# Patient Record
Sex: Male | Born: 1985 | Race: Asian | Hispanic: No | Marital: Married | Smoking: Former smoker
Health system: Southern US, Community
[De-identification: ages and names within clinical notes are randomized; demographics above are authoritative.]

## PROBLEM LIST (undated history)

## (undated) DIAGNOSIS — R42 Dizziness and giddiness: Secondary | ICD-10-CM

## (undated) DIAGNOSIS — M545 Low back pain, unspecified: Secondary | ICD-10-CM

## (undated) DIAGNOSIS — E119 Type 2 diabetes mellitus without complications: Secondary | ICD-10-CM

## (undated) DIAGNOSIS — R0781 Pleurodynia: Secondary | ICD-10-CM

## (undated) DIAGNOSIS — R109 Unspecified abdominal pain: Secondary | ICD-10-CM

## (undated) DIAGNOSIS — E538 Deficiency of other specified B group vitamins: Secondary | ICD-10-CM

## (undated) DIAGNOSIS — E785 Hyperlipidemia, unspecified: Secondary | ICD-10-CM

## (undated) DIAGNOSIS — R7989 Other specified abnormal findings of blood chemistry: Secondary | ICD-10-CM

## (undated) DIAGNOSIS — R079 Chest pain, unspecified: Secondary | ICD-10-CM

## (undated) DIAGNOSIS — E559 Vitamin D deficiency, unspecified: Secondary | ICD-10-CM

## (undated) HISTORY — DX: Type 2 diabetes mellitus without complications: E11.9

## (undated) HISTORY — DX: Dizziness and giddiness: R42

## (undated) HISTORY — DX: Hyperlipidemia, unspecified: E78.5

## (undated) HISTORY — DX: Low back pain, unspecified: M54.50

## (undated) HISTORY — DX: Vitamin D deficiency, unspecified: E55.9

## (undated) HISTORY — DX: Deficiency of other specified B group vitamins: E53.8

## (undated) HISTORY — DX: Unspecified abdominal pain: R10.9

## (undated) HISTORY — DX: Chest pain, unspecified: R07.9

## (undated) HISTORY — DX: Other specified abnormal findings of blood chemistry: R79.89

## (undated) HISTORY — DX: Pleurodynia: R07.81

---

## 2020-07-03 ENCOUNTER — Ambulatory Visit (HOSPITAL_COMMUNITY): Payer: Managed Care, Other (non HMO)

## 2020-07-03 ENCOUNTER — Ambulatory Visit (INDEPENDENT_AMBULATORY_CARE_PROVIDER_SITE_OTHER): Payer: Managed Care, Other (non HMO) | Admitting: Physician Assistant

## 2020-07-03 ENCOUNTER — Encounter: Payer: Self-pay | Admitting: Physician Assistant

## 2020-07-03 ENCOUNTER — Other Ambulatory Visit: Payer: Self-pay

## 2020-07-03 VITALS — BP 120/82 | HR 85 | Temp 97.9°F | Ht 71.0 in | Wt 182.5 lb

## 2020-07-03 DIAGNOSIS — Z114 Encounter for screening for human immunodeficiency virus [HIV]: Secondary | ICD-10-CM

## 2020-07-03 DIAGNOSIS — E785 Hyperlipidemia, unspecified: Secondary | ICD-10-CM

## 2020-07-03 DIAGNOSIS — R1031 Right lower quadrant pain: Secondary | ICD-10-CM | POA: Diagnosis not present

## 2020-07-03 DIAGNOSIS — Z1159 Encounter for screening for other viral diseases: Secondary | ICD-10-CM | POA: Diagnosis not present

## 2020-07-03 DIAGNOSIS — E119 Type 2 diabetes mellitus without complications: Secondary | ICD-10-CM | POA: Diagnosis not present

## 2020-07-03 LAB — COMPREHENSIVE METABOLIC PANEL
ALT: 50 U/L (ref 0–53)
AST: 26 U/L (ref 0–37)
Albumin: 4.9 g/dL (ref 3.5–5.2)
Alkaline Phosphatase: 85 U/L (ref 39–117)
BUN: 9 mg/dL (ref 6–23)
CO2: 30 mEq/L (ref 19–32)
Calcium: 10.3 mg/dL (ref 8.4–10.5)
Chloride: 106 mEq/L (ref 96–112)
Creatinine, Ser: 0.81 mg/dL (ref 0.40–1.50)
GFR: 116.23 mL/min (ref >60.00)
Glucose, Bld: 93 mg/dL (ref 70–99)
Potassium: 4.2 mEq/L (ref 3.5–5.1)
Sodium: 142 mEq/L (ref 135–145)
Total Bilirubin: 0.7 mg/dL (ref 0.2–1.2)
Total Protein: 8.2 g/dL (ref 6.0–8.3)

## 2020-07-03 LAB — POC URINALSYSI DIPSTICK (AUTOMATED)
Bilirubin, UA: NEGATIVE
Blood, UA: NEGATIVE
Glucose, UA: NEGATIVE
Ketones, UA: NEGATIVE
Leukocytes, UA: NEGATIVE
Nitrite, UA: NEGATIVE
Protein, UA: NEGATIVE
Spec Grav, UA: 1.015 (ref 1.010–1.025)
Urobilinogen, UA: 1 E.U./dL
pH, UA: 8 (ref 5.0–8.0)

## 2020-07-03 LAB — CBC WITH DIFFERENTIAL/PLATELET
Basophils Absolute: 0.1 10*3/uL (ref 0.0–0.1)
Basophils Relative: 0.7 % (ref 0.0–3.0)
Eosinophils Absolute: 0.1 10*3/uL (ref 0.0–0.7)
Eosinophils Relative: 1.4 % (ref 0.0–5.0)
HCT: 44 % (ref 39.0–52.0)
Hemoglobin: 14.9 g/dL (ref 13.0–17.0)
Lymphocytes Relative: 27 % (ref 12.0–46.0)
Lymphs Abs: 2.1 10*3/uL (ref 0.7–4.0)
MCHC: 33.9 g/dL (ref 30.0–36.0)
MCV: 81.6 fl (ref 78.0–100.0)
Monocytes Absolute: 0.6 10*3/uL (ref 0.1–1.0)
Monocytes Relative: 7.8 % (ref 3.0–12.0)
Neutro Abs: 4.8 10*3/uL (ref 1.4–7.7)
Neutrophils Relative %: 63.1 % (ref 43.0–77.0)
Platelets: 359 10*3/uL (ref 150.0–400.0)
RBC: 5.4 Mil/uL (ref 4.22–5.81)
RDW: 13.4 % (ref 11.5–15.5)
WBC: 7.6 10*3/uL (ref 4.0–10.5)

## 2020-07-03 LAB — LIPASE: Lipase: 43 U/L (ref 11.0–59.0)

## 2020-07-03 NOTE — Progress Notes (Signed)
Daniel Davenport is a 35 y.o. male is here to establish care.  I acted as a Neurosurgeon for [employer], PA-C Corky Mull, LPN   History of Present Illness:   Chief Complaint  Patient presents with  . Establish Care  . Diabetes  . Hyperlipidemia  . Abdominal Pain    HPI   Pt is here to establish care and medication refills.  Diabetes Pt was dx 3 years ago, currently taking Metformin 500 mg BID. Checks blood sugars once a week, fasting averaging 110. Last A1c was 5.14 May 2020. When he was first diagnosed, A1c was 7. Avoids concentrated sweets.    Hyperlipidemia Currently talking Atorvastatin 10 mg daily. Tolerating well. LDL currently 81, as of December 2021.    Abdominal pain Pt c/o RLQ abd pain x 2 weeks, dull pain off and on mainly at night. Has not tried any medications for this. Pain most noticeable at night and in morning. No changes in bowels, constipation, diarrhea, rectal bleeding, nausea, fever, radiation of pain. Denies any lifting or straining. Since he first noticed this, has increased over the past few weeks. Not affected by eating.  Was around 85 kg when he moved to [place] in August 2021. Feels like his weight has decreased due to change in lifestyle.  Wt Readings from Last 3 Encounters:  07/03/20 182 lb 8 oz (82.8 kg)     Health Maintenance Due  Topic Date Due  . Hepatitis C Screening  Never done  . TETANUS/TDAP  Never done  . COVID-19 Vaccine (3 - Booster) 06/14/2020    Past Medical History:  Diagnosis Date  . Diabetes mellitus without complication (HCC)   . Hyperlipidemia      Social History   Tobacco Use  . Smoking status: Former Games developer  . Smokeless tobacco: Never Used  Vaping Use  . Vaping Use: Never used  Substance Use Topics  . Alcohol use: Yes    Comment: socially  . Drug use: Never    History reviewed. No pertinent surgical history.  Family History  Problem Relation Age of Onset  . Diabetes Maternal Grandfather   .  Diabetes Paternal Grandfather   . Heart disease Paternal Grandfather   . Esophageal cancer Maternal Uncle     PMHx, SurgHx, SocialHx, FamHx, Medications, and Allergies were reviewed in the Visit Navigator and updated as appropriate.   There are no problems to display for this patient.   Social History   Tobacco Use  . Smoking status: Former Games developer  . Smokeless tobacco: Never Used  Vaping Use  . Vaping Use: Never used  Substance Use Topics  . Alcohol use: Yes    Comment: socially  . Drug use: Never    Current Medications and Allergies:    Current Outpatient Medications:  .  atorvastatin (LIPITOR) 10 MG tablet, Take 10 mg by mouth daily., Disp: , Rfl:  .  metFORMIN (GLUCOPHAGE) 500 MG tablet, Take 500 mg by mouth 2 (two) times daily., Disp: , Rfl:   No Known Allergies  Review of Systems   ROS Negative unless otherwise specified per HPI.  Vitals:   Vitals:   07/03/20 0937  BP: 120/82  Pulse: 85  Temp: 97.9 F (36.6 C)  TempSrc: Temporal  SpO2: 97%  Weight: 182 lb 8 oz (82.8 kg)  Height: 5\' 11"  (1.803 m)     Body mass index is 25.45 kg/m.   Physical Exam:    Physical Exam Vitals and nursing note reviewed.  Constitutional:  General: He is not in acute distress.    Appearance: He is well-developed. He is not ill-appearing, toxic-appearing or sickly-appearing.  Cardiovascular:     Rate and Rhythm: Normal rate and regular rhythm.     Pulses: Normal pulses.     Heart sounds: Normal heart sounds, S1 normal and S2 normal.     Comments: No LE edema Pulmonary:     Effort: Pulmonary effort is normal.     Breath sounds: Normal breath sounds.  Abdominal:     General: Abdomen is flat. Bowel sounds are normal.     Palpations: Abdomen is soft.     Tenderness: There is abdominal tenderness in the right lower quadrant. There is no right CVA tenderness, left CVA tenderness, guarding or rebound.  Skin:    General: Skin is warm, dry and intact.  Neurological:      Mental Status: He is alert.     GCS: GCS eye subscore is 4. GCS verbal subscore is 5. GCS motor subscore is 6.  Psychiatric:        Mood and Affect: Mood and affect normal.        Speech: Speech normal.        Behavior: Behavior normal. Behavior is cooperative.    Results for orders placed or performed in visit on 07/03/20  POCT Urinalysis Dipstick (Automated)  Result Value Ref Range   Color, UA yellw    Clarity, UA clear    Glucose, UA Negative Negative   Bilirubin, UA negative    Ketones, UA negative    Spec Grav, UA 1.015 1.010 - 1.025   Blood, UA negative    pH, UA 8.0 5.0 - 8.0   Protein, UA Negative Negative   Urobilinogen, UA 1.0 0.2 or 1.0 E.U./dL   Nitrite, UA negative    Leukocytes, UA Negative Negative     Assessment and Plan:    Jamarkis was seen today for establish care, diabetes, hyperlipidemia and abdominal pain.  Diagnoses and all orders for this visit:  Right lower quadrant abdominal pain Pain is not severe and no evidence of acute abdomen at this time however, his pain is gradually worsening with time without explanation. Update blood work and UA. Order CT scan for early next week for further evaluation, with strict precautions to go to urgent/emergent care if symptoms worsen over weekend. -     CBC with Differential/Platelet -     Comprehensive metabolic panel -     POCT Urinalysis Dipstick (Automated) -     Lipase -     CT Abdomen Pelvis W Contrast; Future  Diabetes mellitus without complication (HCC) Stable. Will recheck HgbA1c when due in about 2.5 months. Continue metformin 500 mg BID but consider de-prescribing if numbers are well controlled.  Hyperlipidemia, unspecified hyperlipidemia type Stable per patient. Continue lipitor 10 mg daily.  Encounter for screening for other viral diseases -     Hepatitis C antibody  Screening for HIV (human immunodeficiency virus) -     HIV Antibody (routine testing w rflx)  CMA or LPN served as scribe  during this visit. History, Physical, and Plan performed by medical provider. The above documentation has been reviewed and is accurate and complete.  Jarold Motto, PA-C [place] [place] 07/03/2020  Follow-up: No follow-ups on file.

## 2020-07-03 NOTE — Patient Instructions (Addendum)
It was great to see you!  For your abdominal pain, I'm going to order a urine and blood tests for further evaluation.  Lets plan for a CT scan to further evaluate. You will be contacted on when/where this is.   Abdominal Pain, Adult Many things can cause belly (abdominal) pain. Most times, belly pain is not dangerous. Many cases of belly pain can be watched and treated at home. Sometimes, though, belly pain is serious. Your doctor will try to find the cause of your belly pain. Follow these instructions at home: Medicines  Take over-the-counter and prescription medicines only as told by your doctor.  Do not take medicines that help you poop (laxatives) unless told by your doctor. General instructions  Watch your belly pain for any changes.  Drink enough fluid to keep your pee (urine) pale yellow.  Keep all follow-up visits as told by your doctor. This is important.   Contact a doctor if:  Your belly pain changes or gets worse.  You are not hungry, or you lose weight without trying.  You are having trouble pooping (constipated) or have watery poop (diarrhea) for more than 2-3 days.  You have pain when you pee or poop.  Your belly pain wakes you up at night.  Your pain gets worse with meals, after eating, or with certain foods.  You are vomiting and cannot keep anything down.  You have a fever.  You have blood in your pee. Get help right away if:  Your pain does not go away as soon as your doctor says it should.  You cannot stop vomiting.  Your pain is only in areas of your belly, such as the right side or the left lower part of the belly.  You have bloody or black poop, or poop that looks like tar.  You have very bad pain, cramping, or bloating in your belly.  You have signs of not having enough fluid or water in your body (dehydration), such as: ? Dark pee, very little pee, or no pee. ? Cracked lips. ? Dry mouth. ? Sunken  eyes. ? Sleepiness. ? Weakness.  You have trouble breathing or chest pain. Summary  Many cases of belly pain can be watched and treated at home.  Watch your belly pain for any changes.  Take over-the-counter and prescription medicines only as told by your doctor.  Contact a doctor if your belly pain changes or gets worse.  Get help right away if you have very bad pain, cramping, or bloating in your belly.  Take care,  Jarold Motto PA-C

## 2020-07-06 LAB — HEPATITIS C ANTIBODY
Hepatitis C Ab: NONREACTIVE
SIGNAL TO CUT-OFF: 0.01 (ref <1.00)

## 2020-07-06 LAB — HIV ANTIBODY (ROUTINE TESTING W REFLEX): HIV 1&2 Ab, 4th Generation: NONREACTIVE

## 2020-07-15 ENCOUNTER — Telehealth: Payer: Self-pay

## 2020-07-15 NOTE — Telephone Encounter (Signed)
Pt called about his STAT CT that was ordered on 07/03/2020. Pt was unable to make original appointment scheduled for CT and called office to reschedule. Not sure where there was miscommunication, but the CT never got scheduled. Pt called today asking about when he could get it done. Per Lelon Mast - can we call pt and check to see how his symptoms are? If CT still needs to be stat, a new referral will need to be placed. Please advise.

## 2020-07-16 NOTE — Telephone Encounter (Signed)
Noted.  Since pain has resolved will not proceed with imaging.  Carlena Sax - ok to cancel/close imaging order.

## 2020-07-16 NOTE — Telephone Encounter (Signed)
Daniel Davenport, spoke to pt his pain is gone.

## 2020-07-16 NOTE — Telephone Encounter (Signed)
Spoke to pt told him there was some miscommunication regarding CT scan. Asked pt if he is still having abdominal pain? Pt said no. So no pain right lower abdomen, pain gone? Pt said yes, denies pain. Told pt okay being as symptoms are gone we will hold off on CT scan. Told pt if symptoms returns please let us know. Pt verbalized understanding.

## 2020-07-17 ENCOUNTER — Ambulatory Visit: Payer: Managed Care, Other (non HMO)

## 2020-08-01 ENCOUNTER — Other Ambulatory Visit: Payer: Self-pay | Admitting: Physician Assistant

## 2020-09-09 ENCOUNTER — Ambulatory Visit: Payer: Managed Care, Other (non HMO) | Admitting: Physician Assistant

## 2020-09-09 ENCOUNTER — Ambulatory Visit (INDEPENDENT_AMBULATORY_CARE_PROVIDER_SITE_OTHER): Payer: Managed Care, Other (non HMO) | Admitting: Physician Assistant

## 2020-09-09 ENCOUNTER — Other Ambulatory Visit: Payer: Self-pay

## 2020-09-09 ENCOUNTER — Encounter: Payer: Self-pay | Admitting: Physician Assistant

## 2020-09-09 VITALS — BP 138/88 | HR 83 | Temp 98.6°F | Ht 71.0 in | Wt 188.4 lb

## 2020-09-09 DIAGNOSIS — R29898 Other symptoms and signs involving the musculoskeletal system: Secondary | ICD-10-CM

## 2020-09-09 DIAGNOSIS — E119 Type 2 diabetes mellitus without complications: Secondary | ICD-10-CM

## 2020-09-09 NOTE — Progress Notes (Signed)
Daniel Davenport is a 35 y.o. male here for a new problem.   History of Present Illness:   Chief Complaint  Patient presents with  . Arm Pain    No Hx injury, pain going on for 5 days  Pains worsen after meals     HPI   L arm pain Pain symptoms started about 5 days ago. There was no inciting event. 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 Pain is in his bicep and feels like it radiates down his L arm to his fingers. He is R handed. States that overall, his symptoms are worst after dinner and at night. Pain is getting worse with time. Pain is 7-8/10. Has not tried anything.  Denies: chest pain, SOB, palpitations, jaw pain, neck pain, recent vaxx  He is on 10 mg lipitor daily. Has been on for years.  Diabetes 6 month follow-up. Current DM meds: metformin 500 mg BID . Blood sugars at home are: <150 per patient. Patient is compliant with medications. Denies: hypoglycemic or hyperglycemic episodes or symptoms..  No results found for: HGBA1C    Past Medical History:  Diagnosis Date  . Diabetes mellitus without complication (HCC)   . Hyperlipidemia      Social History   Tobacco Use  . Smoking status: Former Games developer  . Smokeless tobacco: Never Used  Vaping Use  . Vaping Use: Never used  Substance Use Topics  . Alcohol use: Yes    Comment: socially  . Drug use: Never    History reviewed. No pertinent surgical history.  Family History  Problem Relation Age of Onset  . Diabetes Maternal Grandfather   . Diabetes Paternal Grandfather   . Heart disease Paternal Grandfather   . Esophageal cancer Maternal Uncle     No Known Allergies  Current Medications:   Current Outpatient Medications:  .  atorvastatin (LIPITOR) 10 MG tablet, TAKE 1 TABLET BY MOUTH EVERY DAY, Disp: 90 tablet, Rfl: 1 .  metFORMIN (GLUCOPHAGE) 500 MG tablet, TAKE 1 TABLET BY MOUTH TWICE A DAY, Disp: 180 tablet, Rfl: 1   Review of Systems:   ROS  Negative unless otherwise specified  per HPI.   Vitals:   Vitals:   09/09/20 1535  BP: 138/88  Pulse: 83  Temp: 98.6 F (37 C)  TempSrc: Temporal  Weight: 188 lb 6.4 oz (85.5 kg)  Height: 5\' 11"  (1.803 m)     Body mass index is 26.28 kg/m.  Physical Exam:   Physical Exam Vitals and nursing note reviewed.  Constitutional:      General: He is not in acute distress.    Appearance: He is well-developed. He is not ill-appearing or toxic-appearing.  Cardiovascular:     Rate and Rhythm: Normal rate and regular rhythm.     Pulses: Normal pulses.     Heart sounds: Normal heart sounds, S1 normal and S2 normal.     Comments: No LE edema Pulmonary:     Effort: Pulmonary effort is normal.     Breath sounds: Normal breath sounds.  Musculoskeletal:     Comments: L upper arm Normal ROM No reproducible TTP or obvious defomities  Skin:    General: Skin is warm and dry.  Neurological:     Mental Status: He is alert.     GCS: GCS eye subscore is 4. GCS verbal subscore is 5. GCS motor subscore is 6.     Comments: Normal sensation to b/l arm  4/5  strength of L bicep with resisted flexion and extension  Psychiatric:        Speech: Speech normal.        Behavior: Behavior normal. Behavior is cooperative.        Assessment and Plan:   Daniel Davenport was seen today for arm pain.  Diagnoses and all orders for this visit:  Diabetes mellitus without complication (HCC) Update A1c, adjust metformin 500 mg BID as indicated on lab work. -     Hemoglobin A1c  Left arm weakness Unclear etiology. Will check a CK given sx and use of lipitor. Referral to sports medicine for further eval. He declines rx medication; did recommend OTC ibuprofen to see if this  Helps his symptoms. Worsening precautions advised. -     CK (Creatine Kinase) -     Ambulatory referral to Sports Medicine   Daniel Motto, PA-C

## 2020-09-09 NOTE — Patient Instructions (Signed)
It was great to see you!  Update A1c today.  Also will check your muscle enzyme levels to see if this is elevated.  A referral has been placed for you to see Dr. Clementeen Graham with [facility]. Someone from there office will be in touch soon regarding your appointment with him. His location: Psychiatrist Medicine at [facility] [address] on the 1st floor.   Phone number [phone], Fax [phone].  This location is across the street from the entrance to [employer] and in the same complex as the [facility] and [facility]  Consider over the counter ibuprofen for pain  Take care,  Jarold Motto PA-C

## 2020-09-10 LAB — HEMOGLOBIN A1C: Hgb A1c MFr Bld: 5.9 % (ref 4.6–6.5)

## 2020-09-10 LAB — CK: Total CK: 45 U/L (ref 7–232)

## 2020-09-16 ENCOUNTER — Ambulatory Visit: Payer: Managed Care, Other (non HMO) | Admitting: Family Medicine

## 2020-09-16 NOTE — Progress Notes (Deleted)
   Subjective:    I'm seeing this patient as a consultation for:  Rinaldo Cloud, [place]. Note will be routed back to referring provider/PCP.  CC: L arm pain  I, Howell Groesbeck, LAT, ATC, am serving as scribe for Dr. Clementeen Graham.  HPI: Pt is a 35 y/o male presenting w/ L arm pain since 09/04/20 w/ no known MOI.  He locates his pain to .  Radiating pain: yes into his L hand and fingers L UE numbness/tingling: Aggravating factors: worsens after dinner and at night;  Treatments tried:  Past medical history, Surgical history, Family history, Social history, Allergies, and medications have been entered into the medical record, reviewed. ***  Review of Systems: No new headache, visual changes, nausea, vomiting, diarrhea, constipation, dizziness, abdominal pain, skin rash, fevers, chills, night sweats, weight loss, swollen lymph nodes, body aches, joint swelling, muscle aches, chest pain, shortness of breath, mood changes, visual or auditory hallucinations.   Objective:   There were no vitals filed for this visit. General: Well Developed, well nourished, and in no acute distress.  Neuro/Psych: Alert and oriented x3, extra-ocular muscles intact, able to move all 4 extremities, sensation grossly intact. Skin: Warm and dry, no rashes noted.  Respiratory: Not using accessory muscles, speaking in full sentences, trachea midline.  Cardiovascular: Pulses palpable, no extremity edema. Abdomen: Does not appear distended. MSK: ***  Lab and Radiology Results No results found for this or any previous visit (from the past 72 hour(s)). No results found.  Impression and Recommendations:    Assessment and Plan: 35 y.o. male with ***.  PDMP not reviewed this encounter. No orders of the defined types were placed in this encounter.  No orders of the defined types were placed in this encounter.   Discussed warning signs or symptoms. Please see discharge instructions. Patient expresses understanding.   ***

## 2020-09-24 ENCOUNTER — Ambulatory Visit (INDEPENDENT_AMBULATORY_CARE_PROVIDER_SITE_OTHER): Payer: Managed Care, Other (non HMO) | Admitting: Family Medicine

## 2020-09-24 ENCOUNTER — Other Ambulatory Visit: Payer: Self-pay

## 2020-09-24 ENCOUNTER — Encounter: Payer: Self-pay | Admitting: Family Medicine

## 2020-09-24 ENCOUNTER — Ambulatory Visit: Payer: Self-pay

## 2020-09-24 VITALS — BP 128/88 | HR 75 | Ht 71.0 in | Wt 189.8 lb

## 2020-09-24 DIAGNOSIS — R202 Paresthesia of skin: Secondary | ICD-10-CM | POA: Diagnosis not present

## 2020-09-24 DIAGNOSIS — M79602 Pain in left arm: Secondary | ICD-10-CM | POA: Diagnosis not present

## 2020-09-24 MED ORDER — GABAPENTIN 300 MG PO CAPS: 300.0000 mg | ORAL_CAPSULE | Freq: Every evening | ORAL | 3 refills | Status: DC | PRN

## 2020-09-24 NOTE — Patient Instructions (Addendum)
Thank you for coming in today.  Please perform the exercise program that we have prepared for you and gone over in detail on a daily basis.  In addition to the handout you were provided you can access your program through: [email or web address]   Your unique program code is: [email or web address]  Use a carpal tunnel wrist brace.   Try the gabapentin at bedtime as needed.   Recheck in about 1 month.   If not better we can do the nerve test.

## 2020-09-24 NOTE — Progress Notes (Signed)
   I, Christoper Fabian, LAT, ATC, am serving as scribe for Dr. Clementeen Graham.  Subjective:    I'm seeing this patient as a consultation for Jarold Motto, [place]. Note will be routed back to referring provider/PCP.  CC: Left arm pain/weakness  HPI: Pt is a 35 y/o male c/o L arm pain and weakness ongoing since 09/04/20 w/ no MOI. 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 Pt locates pain to L biceps and into his L palmar hand but not so much in the L forearm.    Radiates: yes- into L forearm and fingers Neck pain: No UE Numbness/tingling: yes in the fingers UE Weakness: yes in the bicep/upper arm region Aggravates: holding something for an extended period of time Treatments tried: Nothing  ry, Surgical history, Family history, Social history, Allergies, and medications have been entered into the medical record, reviewed.   Review of Systems: No new headache, visual changes, nausea, vomiting, diarrhea, constipation, dizziness, abdominal pain, skin rash, fevers, chills, night sweats, weight loss, swollen lymph nodes, body aches, joint swelling, muscle aches, chest pain, shortness of breath, mood changes, visual or auditory hallucinations.   Objective:    Vitals:   09/24/20 1553  BP: 128/88  Pulse: 75  SpO2: 98%   General: Well Developed, well nourished, and in no acute distress.  Neuro/Psych: Alert and oriented x3, extra-ocular muscles intact, able to move all 4 extremities, sensation grossly intact. Skin: Warm and dry, no rashes noted.  Respiratory: Not using accessory muscles, speaking in full sentences, trachea midline.  Cardiovascular: Pulses palpable, no extremity edema. Abdomen: Does not appear distended. MSK: Left arm Mature scar overlying proximal volar forearm otherwise normal-appearing Normal elbow motion and strength. Mildly tender palpation overlying distal biceps  Left wrist normal-appearing nontender.  Negative Tinel's and Phalen's test. Intact grip  strength.   C-spine normal.  Normal cervical motion. Upper extremity Strength reflexes and sensation are equal and normal throughout.    Impression and Recommendations:    Assessment and Plan: 35 y.o. male with left hand paresthesia.  This occurs in the setting of left biceps pain.  I am not sure the two are directly related.  I think the most likely explanation for his hand paresthesias are carpal tunnel syndrome.  Median nerve distribution is the likely explanation based on the location of his symptoms.  Carpal tunnel syndrome is the most likely explanation for that.  Plan to treat for carpal tunnel with wrist splint and gabapentin and recheck in a month.  If not improved consider nerve conduction study.  Proximal biceps pain I think it is biceps tendinitis.  Plan to treat with home exercise program as taught in clinic today by ATC.  Again reassess in a month.  Conduction study may be helpful for this problem to in the future as well.  PDMP not reviewed this encounter. No orders of the defined types were placed in this encounter.  Meds ordered this encounter  Medications  . gabapentin (NEURONTIN) 300 MG capsule    Sig: Take 1 capsule (300 mg total) by mouth at bedtime as needed (nerve pain).    Dispense:  90 capsule    Refill:  3    Discussed warning signs or symptoms. Please see discharge instructions. Patient expresses understanding.   The above documentation has been reviewed and is accurate and complete Clementeen Graham, M.D.

## 2020-10-14 ENCOUNTER — Emergency Department (INDEPENDENT_AMBULATORY_CARE_PROVIDER_SITE_OTHER)
Admission: RE | Admit: 2020-10-14 | Discharge: 2020-10-14 | Disposition: A | Discharge status: Home / Self Care | Payer: Managed Care, Other (non HMO) | Source: Ambulatory Visit | Attending: Family Medicine | Admitting: Family Medicine

## 2020-10-14 ENCOUNTER — Other Ambulatory Visit: Payer: Self-pay

## 2020-10-14 VITALS — BP 133/88 | HR 79 | Temp 97.8°F | Resp 18

## 2020-10-14 DIAGNOSIS — B070 Plantar wart: Secondary | ICD-10-CM | POA: Diagnosis not present

## 2020-10-14 NOTE — ED Triage Notes (Signed)
Pt c/o LT foot pain x 3 days. Says there is a spot where the pain is located. Has tried "corn" pads. Pain 6/10 when walking barefoot.

## 2020-10-14 NOTE — ED Provider Notes (Signed)
[facility]    CSN: [id number] Arrival date & time: 10/14/20  1752      History   Chief Complaint Chief Complaint  Patient presents with  . Foot Pain    LT    HPI Daniel Davenport is a 35 y.o. male.   HPI Patient has painful bump on bottom of foot He has been using over-the-counter medicines but is not getting better He is otherwise well Past Medical History:  Diagnosis Date  . Diabetes mellitus without complication (HCC)   . Hyperlipidemia     There are no problems to display for this patient.   History reviewed. No pertinent surgical history.     Home Medications    Prior to Admission medications   Medication Sig Start Date End Date Taking? Authorizing Provider  atorvastatin (LIPITOR) 10 MG tablet TAKE 1 TABLET BY MOUTH EVERY DAY Patient not taking: Reported on 09/24/2020 08/03/20   Jarold Motto, PA  gabapentin (NEURONTIN) 300 MG capsule Take 1 capsule (300 mg total) by mouth at bedtime as needed (nerve pain). 09/24/20   Rodolph Bong, MD  metFORMIN (GLUCOPHAGE) 500 MG tablet TAKE 1 TABLET BY MOUTH TWICE A DAY 08/03/20   Jarold Motto, PA    Family History Family History  Problem Relation Age of Onset  . Diabetes Maternal Grandfather   . Diabetes Paternal Grandfather   . Heart disease Paternal Grandfather   . Esophageal cancer Maternal Uncle     Social History Social History   Tobacco Use  . Smoking status: Former Games developer  . Smokeless tobacco: Never Used  Vaping Use  . Vaping Use: Never used  Substance Use Topics  . Alcohol use: Yes    Comment: socially  . Drug use: Never     Allergies   Patient has no known allergies.   Review of Systems Review of Systems See HPI  Physical Exam Triage Vital Signs ED Triage Vitals  Enc Vitals Group     BP 10/14/20 1806 133/88     Pulse Rate 10/14/20 1806 79     Resp 10/14/20 1806 18     Temp 10/14/20 1806 97.8 F (36.6 C)     Temp Source 10/14/20 1806 Oral     SpO2 10/14/20 1806 99  %     Weight --      Height --      Head Circumference --      Peak Flow --      Pain Score 10/14/20 1808 6     Pain Loc --      Pain Edu? --      Excl. in GC? --    No data found.  Updated Vital Signs BP 133/88 (BP Location: Right Arm)   Pulse 79   Temp 97.8 F (36.6 C) (Oral)   Resp 18   SpO2 99%      Physical Exam Constitutional:      General: He is not in acute distress.    Appearance: Normal appearance. He is well-developed and normal weight.  HENT:     Head: Normocephalic and atraumatic.     Nose:     Comments: Mask is in place Eyes:     Conjunctiva/sclera: Conjunctivae normal.     Pupils: Pupils are equal, round, and reactive to light.  Cardiovascular:     Rate and Rhythm: Normal rate.  Pulmonary:     Effort: Pulmonary effort is normal. No respiratory distress.  Abdominal:     General: There is  no distension.     Palpations: Abdomen is soft.  Musculoskeletal:        General: Normal range of motion.     Cervical back: Normal range of motion.  Skin:    General: Skin is warm and dry.     Comments:  on the bottom of the left foot at the base of the third toe there is a plantar wart that measures 1-1/2 cm across with a punctate center  Neurological:     Mental Status: He is alert.     Gait: Gait normal.  Psychiatric:        Behavior: Behavior normal.      UC Treatments / Results  Labs (all labs ordered are listed, but only abnormal results are displayed) Labs Reviewed - No data to display  EKG   Radiology No results found.  Procedures Procedures (including critical care time)  Medications Ordered in UC Medications - No data to display  Initial Impression / Assessment and Plan / UC Course  I have reviewed the triage vital signs and the nursing notes.  Pertinent labs & imaging results that were available during my care of the patient were reviewed by me and considered in my medical decision making (see chart for details).     Reviewed  variety of treatment options available Final Clinical Impressions(s) / UC Diagnoses   Final diagnoses:  Plantar wart of left foot     Discharge Instructions     You can purchase a wart removal product at the pharmacy   ED Prescriptions    None     PDMP not reviewed this encounter.   Eustace Moore, MD 10/14/20 [phone]

## 2020-10-14 NOTE — Discharge Instructions (Signed)
You can purchase a wart removal product at the pharmacy

## 2020-10-29 ENCOUNTER — Ambulatory Visit: Payer: Managed Care, Other (non HMO) | Admitting: Family Medicine

## 2021-02-01 ENCOUNTER — Other Ambulatory Visit: Payer: Self-pay | Admitting: Physician Assistant

## 2021-03-03 ENCOUNTER — Other Ambulatory Visit: Payer: Self-pay | Admitting: Physician Assistant

## 2021-03-03 NOTE — Telephone Encounter (Signed)
Okay to refill Atorvastatin?

## 2021-03-17 ENCOUNTER — Other Ambulatory Visit: Payer: Self-pay | Admitting: Physician Assistant

## 2021-04-27 NOTE — Progress Notes (Signed)
Subjective:    Daniel Davenport is a 35 y.o. male and is here for a comprehensive physical exam.  HPI  Health Maintenance Due  Topic Date Due   FOOT EXAM  Never done   OPHTHALMOLOGY EXAM  Never done   URINE MICROALBUMIN  Never done   HEMOGLOBIN A1C  03/11/2021   Acute Concerns: Headache [name] reports he has been experiencing a constant headache just above his left eyes, occasionally radiating to the center of his forehead. This has been onset for about two weeks. He is currently drinking and eating regularly and sleeping well at night despite the pain of his headache. Although he is getting an adequate amount of sleep, he still feels tired.  [name] does state he has been dealing with a little bit of stress, but nothing significant. At this time he rates it a 7 out of 10 but has not taken any medication to manage his pain except ASA x 1. He does feel that his headache does worsen during the day. Denies changes in vision, nausea, vomiting, CP, SOB, or concerns for sleep apnea.    During today's visit it was noticed that his BP was elevated. Ranbir was a bit surprised at this since he hasn't had any other sx of HTN despite his constant headache.   BP Readings from Last 3 Encounters:  04/28/21 110/80  10/14/20 133/88  09/24/20 128/88   Chronic Issues: HLD Currently compliant with taking lipitor 10 mg with no adverse effects. He is managing well.   Diabetes Markee is currently compliant with metformin 500 mg  BID with no adverse effects. Blood sugars at home are: 110- 120 firs thing in the morning.  Denies: hypoglycemic or hyperglycemic episodes or symptoms.   Lab Results  Component Value Date   HGBA1C 5.9 09/09/2020    Health Maintenance: Immunizations -- Covid- UTD as of 04/13/21 Tdap- Due at this time Influenza- UTD Colonoscopy -- N/A PSA -- No results found for: PSA1, PSA Diet -- Improving diet by decreasing sugar intake; eats all food groups Sleep habits -- Normal  Schedule Exercise --  30 minute walks about every other day Weight -- Stable; increased 3 lbs since spring visit Weight history Wt Readings from Last 10 Encounters:  04/28/21 192 lb 8 oz (87.3 kg)  09/24/20 189 lb 12.8 oz (86.1 kg)  09/09/20 188 lb 6.4 oz (85.5 kg)  07/03/20 182 lb 8 oz (82.8 kg)   Body mass index is 26.47 kg/m. Mood -- Stable Tobacco use --  Tobacco Use: Medium Risk   Smoking Tobacco Use: Former   Smokeless Tobacco Use: Never   Passive Exposure: Not on file    Alcohol use ---  reports current alcohol use.   Depression screen PHQ 2/9 04/28/2021  Decreased Interest 0  Down, Depressed, Hopeless 0  PHQ - 2 Score 0     Other providers/specialists: Patient Care Team: Jarold Motto, [place] as PCP - General (Physician Assistant)   PMHx, SurgHx, SocialHx, Medications, and Allergies were reviewed in the Visit Navigator and updated as appropriate.   Past Medical History:  Diagnosis Date   Diabetes mellitus without complication (HCC)    Hyperlipidemia     History reviewed. No pertinent surgical history.   Family History  Problem Relation Age of Onset   Diabetes Maternal Grandfather    Diabetes Paternal Grandfather    Heart disease Paternal Grandfather    Esophageal cancer Maternal Uncle    Prostate cancer Neg Hx  Colon cancer Neg Hx     Social History   Tobacco Use   Smoking status: Former   Smokeless tobacco: Never  Building services engineer Use: Never used  Substance Use Topics   Alcohol use: Yes    Comment: socially   Drug use: Never    Review of Systems:   Review of Systems  Constitutional:  Negative for chills, fever, malaise/fatigue and weight loss.  HENT:  Negative for hearing loss, sinus pain and sore throat.   Respiratory:  Negative for cough and hemoptysis.   Cardiovascular:  Negative for chest pain, palpitations, leg swelling and PND.  Gastrointestinal:  Negative for abdominal pain, constipation, diarrhea, heartburn, nausea and  vomiting.  Genitourinary:  Negative for dysuria, frequency and urgency.  Musculoskeletal:  Negative for back pain, myalgias and neck pain.  Skin:  Negative for itching and rash.  Neurological:  Negative for dizziness, tingling, seizures and headaches.  Endo/Heme/Allergies:  Negative for polydipsia.  Psychiatric/Behavioral:  Negative for depression. The patient is not nervous/anxious.    Objective:   Vitals:   04/28/21 0908 04/28/21 0946  BP: (!) 140/100 110/80  Pulse: 74   Temp: 98.2 F (36.8 C)   SpO2: 98%    Body mass index is 26.47 kg/m.  General Appearance:  Alert, cooperative, no distress, appears stated age  Head:  Normocephalic, without obvious abnormality, atraumatic  Eyes:  PERRL, conjunctiva/corneas clear, EOM's intact, fundi benign, both eyes       Ears:  Normal TM's and external ear canals, both ears  Nose: Nares normal, septum midline, mucosa normal, no drainage    or sinus tenderness  Throat: Lips, mucosa, and tongue normal; teeth and gums normal  Neck: Supple, symmetrical, trachea midline, no adenopathy; thyroid:  No enlargement/tenderness/nodules; no carotit bruit or JVD  Back:   Symmetric, no curvature, ROM normal, no CVA tenderness  Lungs:   Clear to auscultation bilaterally, respirations unlabored  Chest wall:  No tenderness or deformity  Heart:  Regular rate and rhythm, S1 and S2 normal, no murmur, rub   or gallop  Abdomen:   Soft, non-tender, bowel sounds active all four quadrants, no masses, no organomegaly  Extremities: Extremities normal, atraumatic, no cyanosis or edema  Prostate: Not done.   Skin: Skin color, texture, turgor normal, no rashes or lesions  Lymph nodes: Cervical, supraclavicular, and axillary nodes normal  Neurologic: CNII-XII grossly intact. Normal strength, sensation and reflexes throughout    Assessment/Plan:   Encounter for general adult medical examination with abnormal findings Today patient counseled on age appropriate routine  health concerns for screening and prevention, each reviewed and up to date or declined. Immunizations reviewed and up to date or declined. Labs ordered and reviewed. Risk factors for depression reviewed and negative. Hearing function and visual acuity are intact. ADLs screened and addressed as needed. Functional ability and level of safety reviewed and appropriate. Education, counseling and referrals performed based on assessed risks today. Patient provided with a copy of personalized plan for preventive services.   Chronic nonintractable headache, unspecified headache type No red flags on discussion Neuro exam is benign Will update blood work to check for possible organic cause of symptoms Trial ibuprofen 400- 600 mg every six to eight hours as needed  BP upon recheck was normal; may consider periodic monitoring Follow up if symptoms worsen or concerns occur, or visit ED immediately    Hyperlipidemia, unspecified hyperlipidemia type Continue lipitor 10 mg daily  Will complete lipid panel and adjust as  indicated by blood results   Diabetes mellitus without complication (HCC) Update A1c today, adjust metformin 500 mg BID as indicated on lab work - Hemoglobin A1c    Patient Counseling: [x]   Nutrition: Stressed importance of moderation in sodium/caffeine intake, saturated fat and cholesterol, caloric balance, sufficient intake of fresh fruits, vegetables, and fiber.  [x]   Stressed the importance of regular exercise.   []   Substance Abuse: Discussed cessation/primary prevention of tobacco, alcohol, or other drug use; driving or other dangerous activities under the influence; availability of treatment for abuse.   [x]   Injury prevention: Discussed safety belts, safety helmets, smoke detector, smoking near bedding or upholstery.   []   Sexuality: Discussed sexually transmitted diseases, partner selection, use of condoms, avoidance of unintended pregnancy  and contraceptive alternatives.   [x]    Dental health: Discussed importance of regular tooth brushing, flossing, and dental visits.  [x]   Health maintenance and immunizations reviewed. Please refer to Health maintenance section.    I,Havlyn C Ratchford,acting as a for , PA.,have documented all relevant documentation on the behalf of , PA,as directed by  , PA while in the presence of , .  I, , Neurosurgeon, have reviewed all documentation for this visit. The documentation on 04/28/21 for the exam, diagnosis, procedures, and orders are all accurate and complete.   Jarold Motto, PA-C [facility]

## 2021-04-28 ENCOUNTER — Other Ambulatory Visit: Payer: Self-pay

## 2021-04-28 ENCOUNTER — Encounter: Payer: Self-pay | Admitting: Physician Assistant

## 2021-04-28 ENCOUNTER — Ambulatory Visit (INDEPENDENT_AMBULATORY_CARE_PROVIDER_SITE_OTHER): Payer: Managed Care, Other (non HMO) | Admitting: Physician Assistant

## 2021-04-28 VITALS — BP 110/80 | HR 74 | Temp 98.2°F | Ht 71.5 in | Wt 192.5 lb

## 2021-04-28 DIAGNOSIS — E785 Hyperlipidemia, unspecified: Secondary | ICD-10-CM

## 2021-04-28 DIAGNOSIS — Z23 Encounter for immunization: Secondary | ICD-10-CM | POA: Diagnosis not present

## 2021-04-28 DIAGNOSIS — E119 Type 2 diabetes mellitus without complications: Secondary | ICD-10-CM | POA: Diagnosis not present

## 2021-04-28 DIAGNOSIS — R519 Headache, unspecified: Secondary | ICD-10-CM

## 2021-04-28 DIAGNOSIS — Z0001 Encounter for general adult medical examination with abnormal findings: Secondary | ICD-10-CM

## 2021-04-28 DIAGNOSIS — G8929 Other chronic pain: Secondary | ICD-10-CM | POA: Diagnosis not present

## 2021-04-28 LAB — CBC WITH DIFFERENTIAL/PLATELET
Basophils Absolute: 0.1 10*3/uL (ref 0.0–0.1)
Basophils Relative: 0.8 % (ref 0.0–3.0)
Eosinophils Absolute: 0.1 10*3/uL (ref 0.0–0.7)
Eosinophils Relative: 1.8 % (ref 0.0–5.0)
HCT: 43.7 % (ref 39.0–52.0)
Hemoglobin: 14.4 g/dL (ref 13.0–17.0)
Lymphocytes Relative: 30.4 % (ref 12.0–46.0)
Lymphs Abs: 2.1 10*3/uL (ref 0.7–4.0)
MCHC: 33 g/dL (ref 30.0–36.0)
MCV: 82 fl (ref 78.0–100.0)
Monocytes Absolute: 0.4 10*3/uL (ref 0.1–1.0)
Monocytes Relative: 5.8 % (ref 3.0–12.0)
Neutro Abs: 4.3 10*3/uL (ref 1.4–7.7)
Neutrophils Relative %: 61.2 % (ref 43.0–77.0)
Platelets: 309 10*3/uL (ref 150.0–400.0)
RBC: 5.33 Mil/uL (ref 4.22–5.81)
RDW: 13.2 % (ref 11.5–15.5)
WBC: 7 10*3/uL (ref 4.0–10.5)

## 2021-04-28 LAB — COMPREHENSIVE METABOLIC PANEL
ALT: 58 U/L — ABNORMAL HIGH (ref 0–53)
AST: 30 U/L (ref 0–37)
Albumin: 4.7 g/dL (ref 3.5–5.2)
Alkaline Phosphatase: 77 U/L (ref 39–117)
BUN: 10 mg/dL (ref 6–23)
CO2: 30 mEq/L (ref 19–32)
Calcium: 9.9 mg/dL (ref 8.4–10.5)
Chloride: 102 mEq/L (ref 96–112)
Creatinine, Ser: 0.8 mg/dL (ref 0.40–1.50)
GFR: 116 mL/min (ref >60.00)
Glucose, Bld: 88 mg/dL (ref 70–99)
Potassium: 4.7 mEq/L (ref 3.5–5.1)
Sodium: 139 mEq/L (ref 135–145)
Total Bilirubin: 0.5 mg/dL (ref 0.2–1.2)
Total Protein: 7.7 g/dL (ref 6.0–8.3)

## 2021-04-28 LAB — LIPID PANEL
Cholesterol: 165 mg/dL (ref 0–200)
HDL: 47.9 mg/dL (ref >39.00)
LDL Cholesterol: 96 mg/dL (ref 0–99)
NonHDL: 116.82
Total CHOL/HDL Ratio: 3
Triglycerides: 105 mg/dL (ref 0.0–149.0)
VLDL: 21 mg/dL (ref 0.0–40.0)

## 2021-04-28 LAB — MICROALBUMIN / CREATININE URINE RATIO
Creatinine,U: 37.6 mg/dL
Microalb Creat Ratio: 1.9 mg/g (ref 0.0–30.0)
Microalb, Ur: <0.7 mg/dL (ref 0.0–1.9)

## 2021-04-28 LAB — TSH: TSH: 1.39 u[IU]/mL (ref 0.35–5.50)

## 2021-04-28 LAB — HEMOGLOBIN A1C: Hgb A1c MFr Bld: 6.1 % (ref 4.6–6.5)

## 2021-04-28 NOTE — Patient Instructions (Addendum)
It was great to see you!  We are going to update blood work today Please schedule an appointment with an eye doctor to have your vision assessed May take oral ibuprofen 400-600 mg every 8 hours as needed  Headache Precautions: Contact a doctor if: Your symptoms are not helped by medicine. You have a headache that feels different than the other headaches. You feel sick to your stomach (nauseous) or you throw up (vomit). You have a fever. Get help right away if: Your headache gets very bad quickly. Your headache gets worse after a lot of physical activity. You keep throwing up. You have a stiff neck. You have trouble seeing. You have trouble speaking. You have pain in the eye or ear. Your muscles are weak or you lose muscle control. You lose your balance or have trouble walking. You feel like you will pass out (faint) or you pass out. You are mixed up (confused). You have a seizure.   Please go to the lab for blood work.   Our office will call you with your results unless you have chosen to receive results via MyChart.  If your blood work is normal we will follow-up each year for physicals and as scheduled for chronic medical problems.  If anything is abnormal we will treat accordingly and get you in for a follow-up.  Take care,  Lelon Mast

## 2021-04-30 LAB — HM DIABETES EYE EXAM

## 2021-05-03 ENCOUNTER — Other Ambulatory Visit: Payer: Self-pay | Admitting: Physician Assistant

## 2021-05-03 DIAGNOSIS — R7989 Other specified abnormal findings of blood chemistry: Secondary | ICD-10-CM

## 2021-05-04 ENCOUNTER — Encounter: Payer: Self-pay | Admitting: Physician Assistant

## 2021-05-24 ENCOUNTER — Other Ambulatory Visit: Payer: Self-pay

## 2021-05-24 ENCOUNTER — Other Ambulatory Visit (INDEPENDENT_AMBULATORY_CARE_PROVIDER_SITE_OTHER): Payer: Managed Care, Other (non HMO)

## 2021-05-24 DIAGNOSIS — R7989 Other specified abnormal findings of blood chemistry: Secondary | ICD-10-CM

## 2021-05-24 LAB — HEPATIC FUNCTION PANEL
ALT: 43 U/L (ref 0–53)
AST: 21 U/L (ref 0–37)
Albumin: 4.5 g/dL (ref 3.5–5.2)
Alkaline Phosphatase: 85 U/L (ref 39–117)
Bilirubin, Direct: 0.1 mg/dL (ref 0.0–0.3)
Total Bilirubin: 0.5 mg/dL (ref 0.2–1.2)
Total Protein: 7.2 g/dL (ref 6.0–8.3)

## 2021-07-25 ENCOUNTER — Other Ambulatory Visit: Payer: Self-pay | Admitting: Physician Assistant

## 2021-09-20 ENCOUNTER — Telehealth: Payer: Managed Care, Other (non HMO) | Admitting: Physician Assistant

## 2021-09-20 DIAGNOSIS — M545 Low back pain, unspecified: Secondary | ICD-10-CM | POA: Diagnosis not present

## 2021-09-20 MED ORDER — NAPROXEN 500 MG PO TABS: 500.0000 mg | ORAL_TABLET | Freq: Two times a day (BID) | ORAL | 0 refills | Status: DC

## 2021-09-20 MED ORDER — CYCLOBENZAPRINE HCL 10 MG PO TABS: 5.0000 mg | ORAL_TABLET | Freq: Three times a day (TID) | ORAL | 0 refills | Status: DC | PRN

## 2021-09-20 NOTE — Progress Notes (Signed)

## 2021-09-27 ENCOUNTER — Other Ambulatory Visit (HOSPITAL_BASED_OUTPATIENT_CLINIC_OR_DEPARTMENT_OTHER): Payer: Self-pay | Admitting: Orthopaedic Surgery

## 2021-09-27 ENCOUNTER — Ambulatory Visit (INDEPENDENT_AMBULATORY_CARE_PROVIDER_SITE_OTHER): Payer: Managed Care, Other (non HMO) | Admitting: Orthopaedic Surgery

## 2021-09-27 ENCOUNTER — Other Ambulatory Visit (HOSPITAL_BASED_OUTPATIENT_CLINIC_OR_DEPARTMENT_OTHER): Payer: Self-pay

## 2021-09-27 ENCOUNTER — Ambulatory Visit (HOSPITAL_BASED_OUTPATIENT_CLINIC_OR_DEPARTMENT_OTHER)
Admission: RE | Admit: 2021-09-27 | Discharge: 2021-09-27 | Disposition: A | Discharge status: Home / Self Care | Payer: Managed Care, Other (non HMO) | Source: Ambulatory Visit | Attending: Orthopaedic Surgery | Admitting: Orthopaedic Surgery

## 2021-09-27 DIAGNOSIS — S39012A Strain of muscle, fascia and tendon of lower back, initial encounter: Secondary | ICD-10-CM

## 2021-09-27 DIAGNOSIS — M545 Low back pain, unspecified: Secondary | ICD-10-CM

## 2021-09-27 MED ORDER — METHOCARBAMOL 500 MG PO TABS
500.0000 mg | ORAL_TABLET | Freq: Four times a day (QID) | ORAL | 3 refills | Status: DC
  Filled 2021-09-27: qty 30, 8d supply, fill #0

## 2021-09-27 NOTE — Progress Notes (Signed)
? ?                            ? ? ?Chief Complaint: Lower back pain ?  ? ? ?History of Present Illness:  ? ? ?Daniel Davenport is a 36 y.o. male presents with 1 week of lower back pain which did not have any specific injury or incident.  He works as a Electronics engineer.  He states that the pain has come on insidiously and is involving both in the flanks of his lower back.  Denies any centrally based pain.  Denies any radiation down his lower extremities.  He did a telehealth visit with [facility] and was placed on Flexeril as well as naproxen.  He states that this is helped somewhat although he has been a little sedated from the Flexeril ? ? ? ?Surgical History:   ?None ? ?PMH/PSH/Family History/Social History/Meds/Allergies:   ? ?Past Medical History:  ?Diagnosis Date  ? Diabetes mellitus without complication (HCC)   ? Hyperlipidemia   ? ?No past surgical history on file. ?Social History  ? ?Socioeconomic History  ? Marital status: Married  ?  Spouse name: Not on file  ? Number of children: Not on file  ? Years of education: Not on file  ? Highest education level: Not on file  ?Occupational History  ? Not on file  ?Tobacco Use  ? Smoking status: Former  ? Smokeless tobacco: Never  ?Vaping Use  ? Vaping Use: Never used  ?Substance and Sexual Activity  ? Alcohol use: Yes  ?  Comment: socially  ? Drug use: Never  ? Sexual activity: Yes  ?Other Topics Concern  ? Not on file  ?Social History Narrative  ? Working for [employer]  ? ?Social Determinants of Health  ? ?Financial Resource Strain: Not on file  ?Food Insecurity: Not on file  ?Transportation Needs: Not on file  ?Physical Activity: Not on file  ?Stress: Not on file  ?Social Connections: Not on file  ? ?Family History  ?Problem Relation Age of Onset  ? Diabetes Maternal Grandfather   ? Diabetes Paternal Grandfather   ? Heart disease Paternal Grandfather   ? Esophageal cancer Maternal Uncle   ? Prostate cancer Neg Hx   ? Colon cancer Neg Hx   ? ?No Known  Allergies ?Current Outpatient Medications  ?Medication Sig Dispense Refill  ? methocarbamol (ROBAXIN) 500 MG tablet Take 1 tablet (500 mg total) by mouth 4 (four) times daily. 30 tablet 3  ? atorvastatin (LIPITOR) 10 MG tablet TAKE 1 TABLET BY MOUTH EVERY DAY 90 tablet 1  ? cyclobenzaprine (FLEXERIL) 10 MG tablet Take 0.5-1 tablets (5-10 mg total) by mouth 3 (three) times daily as needed for muscle spasms. 30 tablet 0  ? gabapentin (NEURONTIN) 300 MG capsule Take 1 capsule (300 mg total) by mouth at bedtime as needed (nerve pain). (Patient not taking: Reported on 04/28/2021) 90 capsule 3  ? metFORMIN (GLUCOPHAGE) 500 MG tablet TAKE 1 TABLET BY MOUTH TWICE A DAY 180 tablet 1  ? naproxen (NAPROSYN) 500 MG tablet Take 1 tablet (500 mg total) by mouth 2 (two) times daily with a meal. 30 tablet 0  ? ?No current facility-administered medications for this visit.  ? ?No results found. ? ?Review of Systems:   ?A ROS was performed including pertinent positives and negatives as documented in the HPI. ? ?Physical Exam :   ?Constitutional: NAD and appears stated  age ?Neurological: Alert and oriented ?Psych: Appropriate affect and cooperative ?There were no vitals taken for this visit.  ? ?Comprehensive Musculoskeletal Exam:   ? ?Tenderness palpation about his obliques particularly worse on the left side.  There is no radiation down the lower extremities full strength and sensation bilateral lower extremities ? ?Imaging:   ?Xray (lumbar spine 4 views): ?Normal ? ?I personally reviewed and interpreted the radiographs. ? ? ?Assessment:   ?36 y.o. male with lower back pain consistent with a lumbar strain.  At this time I recommended nonsedating Robaxin in lieu of Flexeril in order to hopefully allow him to continue to work without issues with sedation.  I have also recommended ergonomic changes such as standing up multiple times daily as he does do a lot of computer work. ? ?Plan :   ? ?-He will return to clinic as needed ? ? ? ? ?I  personally saw and evaluated the patient, and participated in the management and treatment plan. ? ?Huel Cote, MD ?Attending Physician, Orthopedic Surgery ? ?This document was dictated using Conservation officer, historic buildings. A reasonable attempt at proof reading has been made to minimize errors. ?

## 2021-11-16 ENCOUNTER — Other Ambulatory Visit: Payer: Self-pay | Admitting: Physician Assistant

## 2022-02-08 ENCOUNTER — Ambulatory Visit (INDEPENDENT_AMBULATORY_CARE_PROVIDER_SITE_OTHER): Payer: Managed Care, Other (non HMO) | Admitting: Physician Assistant

## 2022-02-08 ENCOUNTER — Encounter: Payer: Self-pay | Admitting: Physician Assistant

## 2022-02-08 ENCOUNTER — Telehealth: Payer: Self-pay | Admitting: Physician Assistant

## 2022-02-08 VITALS — BP 100/70 | HR 86 | Temp 98.8°F | Ht 71.5 in | Wt 190.0 lb

## 2022-02-08 DIAGNOSIS — E119 Type 2 diabetes mellitus without complications: Secondary | ICD-10-CM | POA: Diagnosis not present

## 2022-02-08 DIAGNOSIS — R1012 Left upper quadrant pain: Secondary | ICD-10-CM | POA: Diagnosis not present

## 2022-02-08 DIAGNOSIS — R21 Rash and other nonspecific skin eruption: Secondary | ICD-10-CM

## 2022-02-08 NOTE — Telephone Encounter (Signed)
FYI, see Triage note. Appt today with you.

## 2022-02-08 NOTE — Telephone Encounter (Signed)
Called patient due to him making a virtual video visit for left upper abdominal pain   Patient states: -Pain has been onset for 4-5 days and rated a 6-7/10  - Dull pain   - Pain is focused from belly button to left side of rib cage  - He isn't experiencing any other symptoms  - Pain occurs when walking and after eating  - Tried pepto but this didn't help   Patient has been transferred to triage

## 2022-02-08 NOTE — Patient Instructions (Signed)
It was great to see you!  We are getting blood work today to further evaluate your symptoms  Please start over the counter prilosec to see if this can calm down the acid in your stomach which may be contributing to your symptoms  If your rash gets worse, please let me know -- I will go ahead and place dermatology referral  We may need to get a CT scan or ultrasound of your abdomen if your symptoms do not improve  If worsening, please go to the ER  Take care,  Jarold Motto PA-C

## 2022-02-08 NOTE — Progress Notes (Signed)
Daniel Davenport is a 36 y.o. male here for a new problem.  History of Present Illness:   Chief Complaint  Patient presents with   Abdominal Pain    Pt states abdominal pain has been going on since Friday morning. Pain is on and off, happens after eating, walking and at night.    HPI  Abdominal pain Patient reports that symptoms have been going on since Friday. Intermittent. Happens after eating, walking and at night. Pain is in LUQ and near belly button. After he eats and walks has pain. Tried pepto-bismol without relief. 6-7/10. Dull pain. Poor appetite. No certain foods making it worse.  Does not feel like it is necessarily getting worse with time.  Denies constipation, diarrhea, nausea, vomiting, cough, urinary symptoms.  Did recently travel to [place].  Denies excessive alcohol intake.  Diabetes 10 month follow-up. Current DM meds: Metformin 500 mg twice daily. Blood sugars at home are: Not checked. Patient is compliant with medications. Denies: hypoglycemic or hyperglycemic episodes or symptoms. This patient's diabetes is complicated by hyperlipidemia.  Lab Results  Component Value Date   HGBA1C 6.1 04/28/2021   Skin rash He has noticed red dots on his skin since his abdominal pain has started.  He has not had any fever, chills, recent upper respiratory infection.  Area is not itchy or painful.    Past Medical History:  Diagnosis Date   Diabetes mellitus without complication (HCC)    Hyperlipidemia      Social History   Tobacco Use   Smoking status: Former   Smokeless tobacco: Never  Building services engineer Use: Never used  Substance Use Topics   Alcohol use: Yes    Comment: socially   Drug use: Never    History reviewed. No pertinent surgical history.  Family History  Problem Relation Age of Onset   Diabetes Maternal Grandfather    Diabetes Paternal Grandfather    Heart disease Paternal Grandfather    Esophageal cancer Maternal Uncle    Prostate cancer Neg Hx     Colon cancer Neg Hx     No Known Allergies  Current Medications:   Current Outpatient Medications:    atorvastatin (LIPITOR) 10 MG tablet, TAKE 1 TABLET BY MOUTH EVERY DAY, Disp: 90 tablet, Rfl: 1   metFORMIN (GLUCOPHAGE) 500 MG tablet, TAKE 1 TABLET BY MOUTH TWICE A DAY, Disp: 180 tablet, Rfl: 1   Review of Systems:   ROS Negative unless otherwise specified per HPI.  Vitals:   Vitals:   02/08/22 1512  BP: 100/70  Pulse: 86  Temp: 98.8 F (37.1 C)  SpO2: 98%  Weight: 190 lb (86.2 kg)  Height: 5' 11.5" (1.816 m)     Body mass index is 26.13 kg/m.  Physical Exam:   Physical Exam Vitals and nursing note reviewed.  Constitutional:      General: He is not in acute distress.    Appearance: He is well-developed. He is not ill-appearing or toxic-appearing.  Cardiovascular:     Rate and Rhythm: Normal rate and regular rhythm.     Pulses: Normal pulses.     Heart sounds: Normal heart sounds, S1 normal and S2 normal.  Pulmonary:     Effort: Pulmonary effort is normal.     Breath sounds: Normal breath sounds.  Abdominal:     General: Abdomen is flat. Bowel sounds are increased.     Palpations: Abdomen is soft.     Tenderness: There is no abdominal tenderness. There is  no right CVA tenderness, left CVA tenderness, guarding or rebound.  Skin:    General: Skin is warm and dry.     Comments: Tiny multiple scattered erythematous papules on bilateral arms  Neurological:     Mental Status: He is alert.     GCS: GCS eye subscore is 4. GCS verbal subscore is 5. GCS motor subscore is 6.  Psychiatric:        Speech: Speech normal.        Behavior: Behavior normal. Behavior is cooperative.     Assessment and Plan:   Diabetes mellitus without complication (HCC) We will recheck A1c today and adjust metformin 500 mg twice daily as indicated on results  Left upper quadrant abdominal pain No red flags or evidence of acute abdomen on my exam today We will check blood work to  assess for possible etiology Recommend bland diet, hydration, and trialing over-the-counter Prilosec If lack of improvement will need imaging If worsening needs to go to the ER  Skin rash Unclear etiology Appears to be possible cherry hemangiomas We will go ahead and place referral to dermatology but if worsens in the meantime will need further evaluation in our office prior to dermatology appointment   Jarold Motto, PA-C

## 2022-02-08 NOTE — Telephone Encounter (Signed)
Patient Name: Daniel Davenport Gender: Male DOB: 1985/10/13 Age: 36 Y 7 M 21 D Return Phone Number: [phone] (Primary) Address: City/ State/ Zip: [place]  [address] Client [name] - Administrator, [facility] at [facility] Provider Bufford Buttner, [place]- [place] Contact Type Call Who Is Calling Patient / Member / Family / Caregiver Call Type Triage / Clinical Relationship To Patient Self Return Phone Number [phone] (Primary) Chief Complaint CHEST PAIN - pain, pressure, heaviness or tightness Reason for Call Symptomatic / Request for Health Information Initial Comment Caller states he is having pain in his upper left side radiating into his ribs and the pain is rated at a 6 to 7. Translation No Nurse Assessment Nurse: Nunzio Cory, RN, Sherrie Date/Time (Eastern Time): 02/08/2022 11:20:51 AM Confirm and document reason for call. If symptomatic, describe symptoms. ---Caller states having pain in his upper left side of stomach and radiates into lower rib area. States pain comes and goes, worse when walking or after eating. Pain level 6-7/10. Started Friday morning. No vomiting/diarrhea. No nausea, +eating/drinking. Does the patient have any new or worsening symptoms? ---Yes Will a triage be completed? ---Yes Related visit to physician within the last 2 weeks? ---No Does the PT have any chronic conditions? (i.e. diabetes, asthma, this includes High risk factors for pregnancy, etc.) ---Yes List chronic conditions. ---Type 2 diabetic Is this a behavioral health or substance abuse call? ---No Guidelines Guideline Title Affirmed Question Affirmed Notes Nurse Date/Time (Eastern Time) Abdominal Pain - Male [1] MODERATE pain (e.g., interferes with normal activities) AND [2] pain comes and goes Nunzio Cory, Charity fundraiser, Sherrie 02/08/2022 11:24:09 AM  Guidelines Guideline Title Affirmed Question Affirmed Notes Nurse Date/Time  (Eastern Time) (cramps) AND [3] present > 24 hours (Exception: Pain with Vomiting or Diarrhea - see that Guideline.) Disp. Time Lamount Cohen Time) Disposition Final User 02/08/2022 11:18:40 AM Send to Urgent Lottie Dawson 02/08/2022 11:28:31 AM See PCP within 24 Hours Yes Nunzio Cory, RN, Sherrie Final Disposition 02/08/2022 11:28:31 AM See PCP within 24 Hours Yes Nunzio Cory, RN, Sherrie Caller Disagree/Comply Comply Caller Understands Yes PreDisposition Call Doctor Care Advice Given Per Guideline SEE PCP WITHIN 24 HOURS: * IF OFFICE WILL BE OPEN: You need to be examined within the next 24 hours. Call your doctor (or NP/PA) when the office opens and make an appointment. DIET: * Drink adequate fluids. Eat a bland diet. * Avoid alcohol or caffeinated beverages. * Avoid greasy or fatty foods. CALL BACK IF: * Severe pain lasts over 1 hour * Constant pain lasts over 2 hours * You become worse Referrals REFERRED TO PCP OFFICE

## 2022-02-08 NOTE — Telephone Encounter (Signed)
Patient states: - He was transferred back to office line after speaking with triage nurse - He was informed to be seen by PCP within 24 hours   Patient has an OV with Jarold Motto on 09/05 @ 3:20pm. Awaiting triage note.

## 2022-02-09 LAB — CBC WITH DIFFERENTIAL/PLATELET
Basophils Absolute: 0.1 10*3/uL (ref 0.0–0.1)
Basophils Relative: 1.3 % (ref 0.0–3.0)
Eosinophils Absolute: 0.2 10*3/uL (ref 0.0–0.7)
Eosinophils Relative: 2.7 % (ref 0.0–5.0)
HCT: 40.7 % (ref 39.0–52.0)
Hemoglobin: 13.6 g/dL (ref 13.0–17.0)
Lymphocytes Relative: 37.1 % (ref 12.0–46.0)
Lymphs Abs: 2.9 10*3/uL (ref 0.7–4.0)
MCHC: 33.5 g/dL (ref 30.0–36.0)
MCV: 82.2 fl (ref 78.0–100.0)
Monocytes Absolute: 0.5 10*3/uL (ref 0.1–1.0)
Monocytes Relative: 6.1 % (ref 3.0–12.0)
Neutro Abs: 4.1 10*3/uL (ref 1.4–7.7)
Neutrophils Relative %: 52.8 % (ref 43.0–77.0)
Platelets: 273 10*3/uL (ref 150.0–400.0)
RBC: 4.96 Mil/uL (ref 4.22–5.81)
RDW: 13.2 % (ref 11.5–15.5)
WBC: 7.8 10*3/uL (ref 4.0–10.5)

## 2022-02-09 LAB — URINALYSIS, ROUTINE W REFLEX MICROSCOPIC
Bilirubin Urine: NEGATIVE
Hgb urine dipstick: NEGATIVE
Ketones, ur: NEGATIVE
Leukocytes,Ua: NEGATIVE
Nitrite: NEGATIVE
RBC / HPF: NONE SEEN (ref 0–?)
Specific Gravity, Urine: 1.02 (ref 1.000–1.030)
Total Protein, Urine: NEGATIVE
Urine Glucose: NEGATIVE
Urobilinogen, UA: 0.2 (ref 0.0–1.0)
pH: 8 (ref 5.0–8.0)

## 2022-02-09 LAB — LIPASE: Lipase: 29 U/L (ref 11.0–59.0)

## 2022-02-09 LAB — COMPREHENSIVE METABOLIC PANEL
ALT: 21 U/L (ref 0–53)
AST: 16 U/L (ref 0–37)
Albumin: 4.3 g/dL (ref 3.5–5.2)
Alkaline Phosphatase: 68 U/L (ref 39–117)
BUN: 11 mg/dL (ref 6–23)
CO2: 29 mEq/L (ref 19–32)
Calcium: 9.6 mg/dL (ref 8.4–10.5)
Chloride: 106 mEq/L (ref 96–112)
Creatinine, Ser: 0.75 mg/dL (ref 0.40–1.50)
GFR: 117.63 mL/min (ref >60.00)
Glucose, Bld: 74 mg/dL (ref 70–99)
Potassium: 4.3 mEq/L (ref 3.5–5.1)
Sodium: 141 mEq/L (ref 135–145)
Total Bilirubin: 0.6 mg/dL (ref 0.2–1.2)
Total Protein: 7.2 g/dL (ref 6.0–8.3)

## 2022-02-09 LAB — HEMOGLOBIN A1C: Hgb A1c MFr Bld: 6.3 % (ref 4.6–6.5)

## 2022-02-09 LAB — H. PYLORI ANTIBODY, IGG: H Pylori IgG: NEGATIVE

## 2022-02-12 ENCOUNTER — Encounter: Payer: Self-pay | Admitting: Physician Assistant

## 2022-02-13 ENCOUNTER — Other Ambulatory Visit: Payer: Self-pay | Admitting: Physician Assistant

## 2022-02-14 ENCOUNTER — Other Ambulatory Visit: Payer: Self-pay | Admitting: Physician Assistant

## 2022-02-14 DIAGNOSIS — R1012 Left upper quadrant pain: Secondary | ICD-10-CM

## 2022-02-15 ENCOUNTER — Ambulatory Visit (HOSPITAL_COMMUNITY)
Admission: RE | Admit: 2022-02-15 | Discharge: 2022-02-15 | Disposition: A | Discharge status: Home / Self Care | Payer: Managed Care, Other (non HMO) | Source: Ambulatory Visit | Attending: Physician Assistant | Admitting: Physician Assistant

## 2022-02-15 DIAGNOSIS — R1012 Left upper quadrant pain: Secondary | ICD-10-CM | POA: Diagnosis present

## 2022-02-15 MED ORDER — IOHEXOL 300 MG/ML  SOLN
100.0000 mL | Freq: Once | INTRAMUSCULAR | Status: AC | PRN
Start: 2022-02-15 — End: 2022-02-15
  Administered 2022-02-15: 100 mL via INTRAVENOUS

## 2022-02-15 MED ORDER — IOHEXOL 9 MG/ML PO SOLN
1000.0000 mL | Freq: Once | ORAL | Status: AC
  Administered 2022-02-15: 1000 mL via ORAL

## 2022-05-27 ENCOUNTER — Ambulatory Visit (INDEPENDENT_AMBULATORY_CARE_PROVIDER_SITE_OTHER): Payer: Managed Care, Other (non HMO) | Admitting: Orthopaedic Surgery

## 2022-05-27 ENCOUNTER — Ambulatory Visit (HOSPITAL_BASED_OUTPATIENT_CLINIC_OR_DEPARTMENT_OTHER): Payer: Managed Care, Other (non HMO)

## 2022-05-27 DIAGNOSIS — G8929 Other chronic pain: Secondary | ICD-10-CM | POA: Diagnosis not present

## 2022-05-27 DIAGNOSIS — M222X1 Patellofemoral disorders, right knee: Secondary | ICD-10-CM | POA: Diagnosis not present

## 2022-05-27 DIAGNOSIS — M25561 Pain in right knee: Secondary | ICD-10-CM | POA: Diagnosis not present

## 2022-05-27 NOTE — Progress Notes (Signed)
Chief Complaint: Right knee pain    History of Present Illness:    Daniel Davenport is a 36 y.o. male presents with right knee pain which has been ongoing now over the course of the last several weeks.  States that he is experiencing a pain that is deep and goes to the back of the knee.  Lifting and bending increases knee pain.  He sometimes feels like he needs to bend the knee to improvement.  He did have an MRI in [place].   Surgical History:   None  PMH/PSH/Family History/Social History/Meds/Allergies:    Past Medical History:  Diagnosis Date   Diabetes mellitus without complication (HCC)    Hyperlipidemia    No past surgical history on file. Social History   Socioeconomic History   Marital status: Married    Spouse name: Not on file   Number of children: Not on file   Years of education: Not on file   Highest education level: Not on file  Occupational History   Not on file  Tobacco Use   Smoking status: Former   Smokeless tobacco: Never  Vaping Use   Vaping Use: Never used  Substance and Sexual Activity   Alcohol use: Yes    Comment: socially   Drug use: Never   Sexual activity: Yes  Other Topics Concern   Not on file  Social History Narrative   Working for [employer]   Social Determinants of Health   Financial Resource Strain: Not on file  Food Insecurity: Not on file  Transportation Needs: Not on file  Physical Activity: Not on file  Stress: Not on file  Social Connections: Not on file   Family History  Problem Relation Age of Onset   Diabetes Maternal Grandfather    Diabetes Paternal Grandfather    Heart disease Paternal Grandfather    Esophageal cancer Maternal Uncle    Prostate cancer Neg Hx    Colon cancer Neg Hx    No Known Allergies Current Outpatient Medications  Medication Sig Dispense Refill   atorvastatin (LIPITOR) 10 MG tablet TAKE 1 TABLET BY MOUTH EVERY DAY 90 tablet 1   metFORMIN (GLUCOPHAGE) 500 MG tablet  TAKE 1 TABLET BY MOUTH TWICE A DAY 180 tablet 1   No current facility-administered medications for this visit.   No results found.  Review of Systems:   A ROS was performed including pertinent positives and negatives as documented in the HPI.  Physical Exam :   Constitutional: NAD and appears stated age Neurological: Alert and oriented Psych: Appropriate affect and cooperative There were no vitals taken for this visit.   Comprehensive Musculoskeletal Exam:    Tenderness palpation at the patellofemoral joint.  Range of motion is from 0 to 130 degrees.  Positive patellar grind.  Negative ligamentous exam  Imaging:   Xray (lumbar spine 4 views): Normal  I personally reviewed and interpreted the radiographs.   Assessment:   36 y.o. male with right knee pain consistent with possible patellofemoral pain versus Hoffa's fat pad impingement.  I did discuss that an injection would be the only way to truly differentiate between the 2.  With that being said he would like to begin with patellofemoral exercises which I will provide to him.  He will see me back in 2 weeks as needed should  he want a right knee ultrasound-guided injection  Plan :    -He will return to clinic as needed     I personally saw and evaluated the patient, and participated in the management and treatment plan.  Vanetta Mulders, MD Attending Physician, Orthopedic Surgery  This document was dictated using Dragon voice recognition software. A reasonable attempt at proof reading has been made to minimize errors.

## 2022-06-13 ENCOUNTER — Other Ambulatory Visit: Payer: Self-pay | Admitting: Physician Assistant

## 2022-06-15 ENCOUNTER — Encounter: Payer: Self-pay | Admitting: Physician Assistant

## 2022-06-15 ENCOUNTER — Ambulatory Visit (INDEPENDENT_AMBULATORY_CARE_PROVIDER_SITE_OTHER): Payer: Managed Care, Other (non HMO) | Admitting: Physician Assistant

## 2022-06-15 VITALS — BP 110/80 | HR 93 | Temp 97.5°F | Ht 71.5 in | Wt 195.5 lb

## 2022-06-15 DIAGNOSIS — R21 Rash and other nonspecific skin eruption: Secondary | ICD-10-CM

## 2022-06-15 MED ORDER — TRIAMCINOLONE ACETONIDE 0.025 % EX CREA: TOPICAL_CREAM | CUTANEOUS | 0 refills | Status: DC

## 2022-06-15 NOTE — Progress Notes (Signed)
Daniel Davenport is a 37 y.o. male here for a new problem.  History of Present Illness:   Chief Complaint  Patient presents with   Pruritis    Pt c/o Bilateral lower extremity itching, started 10 days ago. Red bumps on legs.    HPI  BLE itching He reports red bumps diffusely along his bilateral legs below his knee, sparing his feet. These bumps have been severely itching, sometimes keeping him awake at night. This began x1 month ago. He suspected that this may have been due to bed bugs as he was traveling and stayed in an Airbnb prior to onset of his symptoms. However, he did not see any bed bugs. He denies any pain associated with this.  Patient denies any new products or detergents prior to onset of his rash. He denies any new medications or supplements prior to onset. He has been applying a moisturizing Cetaphil cream to the area with some relief. Patient has not tried any medications to relieve the itching.  Denies fevers, chills.  Past Medical History:  Diagnosis Date   Diabetes mellitus without complication ([place])    Hyperlipidemia      Social History   Tobacco Use   Smoking status: Former   Smokeless tobacco: Never  Scientific laboratory technician Use: Never used  Substance Use Topics   Alcohol use: Yes    Comment: socially   Drug use: Never    History reviewed. No pertinent surgical history.  Family History  Problem Relation Age of Onset   Diabetes Maternal Grandfather    Diabetes Paternal Grandfather    Heart disease Paternal Grandfather    Esophageal cancer Maternal Uncle    Prostate cancer Neg Hx    Colon cancer Neg Hx     No Known Allergies  Current Medications:   Current Outpatient Medications:    atorvastatin (LIPITOR) 10 MG tablet, TAKE 1 TABLET BY MOUTH EVERY DAY, Disp: 90 tablet, Rfl: 1   metFORMIN (GLUCOPHAGE) 500 MG tablet, TAKE 1 TABLET BY MOUTH TWICE A DAY, Disp: 180 tablet, Rfl: 1   Review of Systems:   Review of Systems  Constitutional:   Negative for fever.  HENT:  Negative for congestion, sinus pain and sore throat.   Respiratory:  Negative for cough, shortness of breath and wheezing.   Cardiovascular:  Negative for chest pain and palpitations.  Gastrointestinal:  Negative for blood in stool, constipation, diarrhea, nausea and vomiting.  Genitourinary:  Negative for dysuria, frequency and hematuria.  Musculoskeletal:  Negative for joint pain and myalgias.  Skin:  Positive for itching and rash.    Vitals:   Vitals:   06/15/22 0833  BP: 110/80  Pulse: 93  Temp: (!) 97.5 F (36.4 C)  TempSrc: Temporal  SpO2: 98%  Weight: 195 lb 8 oz (88.7 kg)  Height: 5' 11.5" (1.816 m)     Body mass index is 26.89 kg/m.  Physical Exam:   Physical Exam Vitals and nursing note reviewed.  Constitutional:      Appearance: He is well-developed.  HENT:     Head: Normocephalic.  Eyes:     Conjunctiva/sclera: Conjunctivae normal.     Pupils: Pupils are equal, round, and reactive to light.  Pulmonary:     Effort: Pulmonary effort is normal.  Musculoskeletal:        General: Normal range of motion.     Cervical back: Normal range of motion.  Skin:    General: Skin is warm and dry.  Comments: Bilateral erythematous patches without tenderness, induration, fluctuance to lower legs  Neurological:     Mental Status: He is alert and oriented to person, place, and time.  Psychiatric:        Behavior: Behavior normal.        Thought Content: Thought content normal.        Judgment: Judgment normal.     Assessment and Plan:   Rash and nonspecific skin eruption No red flags Suspect possible contact dermatitis Trial topical triamcinolone and daily allergy pill x 2 weeks If new/worsening, close follow-up for possible blood work/bx  I,Alexis Herring,acting as a scribe for [employer], PA.,have documented all relevant documentation on the behalf of Inda Coke, PA,as directed by  Inda Coke, PA while in the  presence of Inda Coke, [place].  I, Inda Coke, [place], have reviewed all documentation for this visit. The documentation on 06/15/22 for the exam, diagnosis, procedures, and orders are all accurate and complete.   Inda Coke, PA-C

## 2022-06-15 NOTE — Patient Instructions (Signed)
It was great to see you!  May use over the counter antihistamines such as Zyrtec (cetirizine), Claritin (loratadine), Allegra (fexofenadine), or Xyzal (levocetirizine) daily as needed. May take twice a day if needed as long as it does not cause drowsiness. Take for 2 weeks  Apply triamcinolone ointment to affected area 1-2 times daily  Take care,  Inda Coke PA-C

## 2022-08-30 NOTE — Progress Notes (Signed)
Daniel Davenport is a 37 y.o. male here for a new problem.  History of Present Illness:   No chief complaint on file.   HPI  Fever and Body Aches Reports experiencing    Past Medical History:  Diagnosis Date   Diabetes mellitus without complication ([place])    Hyperlipidemia      Social History   Tobacco Use   Smoking status: Former   Smokeless tobacco: Never  Scientific laboratory technician Use: Never used  Substance Use Topics   Alcohol use: Yes    Comment: socially   Drug use: Never    No past surgical history on file.  Family History  Problem Relation Age of Onset   Diabetes Maternal Grandfather    Diabetes Paternal Grandfather    Heart disease Paternal Grandfather    Esophageal cancer Maternal Uncle    Prostate cancer Neg Hx    Colon cancer Neg Hx     No Known Allergies  Current Medications:   Current Outpatient Medications:    atorvastatin (LIPITOR) 10 MG tablet, TAKE 1 TABLET BY MOUTH EVERY DAY, Disp: 90 tablet, Rfl: 1   metFORMIN (GLUCOPHAGE) 500 MG tablet, TAKE 1 TABLET BY MOUTH TWICE A DAY, Disp: 180 tablet, Rfl: 1   triamcinolone (KENALOG) 0.025 % cream, Apply to affected area 1-2 times daily, Disp: 454 g, Rfl: 0   Review of Systems:   ROS  Vitals:   There were no vitals filed for this visit.   There is no height or weight on file to calculate BMI.  Physical Exam:   Physical Exam  Assessment and Plan:   ***   I,Alexander Ruley,acting as a scribe for Inda Coke, PA.,have documented all relevant documentation on the behalf of Inda Coke, PA,as directed by  Inda Coke, PA while in the presence of Inda Coke, [place].   ***   Inda Coke, PA-C

## 2022-08-31 ENCOUNTER — Ambulatory Visit (INDEPENDENT_AMBULATORY_CARE_PROVIDER_SITE_OTHER): Payer: Managed Care, Other (non HMO) | Admitting: Physician Assistant

## 2022-08-31 VITALS — BP 138/100 | HR 79 | Temp 97.5°F | Ht 71.5 in | Wt 198.0 lb

## 2022-08-31 DIAGNOSIS — E559 Vitamin D deficiency, unspecified: Secondary | ICD-10-CM

## 2022-08-31 DIAGNOSIS — R52 Pain, unspecified: Secondary | ICD-10-CM

## 2022-08-31 DIAGNOSIS — E119 Type 2 diabetes mellitus without complications: Secondary | ICD-10-CM | POA: Diagnosis not present

## 2022-08-31 DIAGNOSIS — R350 Frequency of micturition: Secondary | ICD-10-CM | POA: Diagnosis not present

## 2022-08-31 DIAGNOSIS — R5383 Other fatigue: Secondary | ICD-10-CM | POA: Diagnosis not present

## 2022-08-31 DIAGNOSIS — E538 Deficiency of other specified B group vitamins: Secondary | ICD-10-CM | POA: Diagnosis not present

## 2022-08-31 LAB — CBC WITH DIFFERENTIAL/PLATELET
Basophils Absolute: 0.1 10*3/uL (ref 0.0–0.1)
Basophils Relative: 1 % (ref 0.0–3.0)
Eosinophils Absolute: 0.1 10*3/uL (ref 0.0–0.7)
Eosinophils Relative: 2.2 % (ref 0.0–5.0)
HCT: 41.1 % (ref 39.0–52.0)
Hemoglobin: 14.2 g/dL (ref 13.0–17.0)
Lymphocytes Relative: 32.5 % (ref 12.0–46.0)
Lymphs Abs: 2.2 10*3/uL (ref 0.7–4.0)
MCHC: 34.7 g/dL (ref 30.0–36.0)
MCV: 79.7 fl (ref 78.0–100.0)
Monocytes Absolute: 0.4 10*3/uL (ref 0.1–1.0)
Monocytes Relative: 6.1 % (ref 3.0–12.0)
Neutro Abs: 3.9 10*3/uL (ref 1.4–7.7)
Neutrophils Relative %: 58.2 % (ref 43.0–77.0)
Platelets: 296 10*3/uL (ref 150.0–400.0)
RBC: 5.16 Mil/uL (ref 4.22–5.81)
RDW: 13.3 % (ref 11.5–15.5)
WBC: 6.6 10*3/uL (ref 4.0–10.5)

## 2022-08-31 LAB — POC URINALSYSI DIPSTICK (AUTOMATED)
Bilirubin, UA: NEGATIVE
Blood, UA: NEGATIVE
Glucose, UA: NEGATIVE
Ketones, UA: NEGATIVE
Leukocytes, UA: NEGATIVE
Nitrite, UA: NEGATIVE
Protein, UA: NEGATIVE
Spec Grav, UA: 1.015 (ref 1.010–1.025)
Urobilinogen, UA: 0.2 E.U./dL
pH, UA: 7 (ref 5.0–8.0)

## 2022-08-31 LAB — COMPREHENSIVE METABOLIC PANEL
ALT: 34 U/L (ref 0–53)
AST: 21 U/L (ref 0–37)
Albumin: 4.7 g/dL (ref 3.5–5.2)
Alkaline Phosphatase: 83 U/L (ref 39–117)
BUN: 9 mg/dL (ref 6–23)
CO2: 26 mEq/L (ref 19–32)
Calcium: 9.7 mg/dL (ref 8.4–10.5)
Chloride: 104 mEq/L (ref 96–112)
Creatinine, Ser: 0.76 mg/dL (ref 0.40–1.50)
GFR: 116.7 mL/min (ref >60.00)
Glucose, Bld: 127 mg/dL — ABNORMAL HIGH (ref 70–99)
Potassium: 4.2 mEq/L (ref 3.5–5.1)
Sodium: 138 mEq/L (ref 135–145)
Total Bilirubin: 0.6 mg/dL (ref 0.2–1.2)
Total Protein: 7.1 g/dL (ref 6.0–8.3)

## 2022-08-31 LAB — POC INFLUENZA A&B (BINAX/QUICKVUE)
Influenza A, POC: NEGATIVE
Influenza B, POC: NEGATIVE

## 2022-08-31 LAB — TSH: TSH: 1.75 u[IU]/mL (ref 0.35–5.50)

## 2022-08-31 LAB — POC COVID19 BINAXNOW: SARS Coronavirus 2 Ag: NEGATIVE

## 2022-08-31 LAB — HEMOGLOBIN A1C: Hgb A1c MFr Bld: 6.6 % — ABNORMAL HIGH (ref 4.6–6.5)

## 2022-08-31 LAB — CK: Total CK: 76 U/L (ref 7–232)

## 2022-08-31 LAB — VITAMIN B12: Vitamin B-12: 286 pg/mL (ref 211–911)

## 2022-08-31 LAB — VITAMIN D 25 HYDROXY (VIT D DEFICIENCY, FRACTURES): VITD: 22.37 ng/mL — ABNORMAL LOW (ref 30.00–100.00)

## 2022-08-31 NOTE — Patient Instructions (Signed)
It was great to see you!  Continue to monitor your symptoms Let me know if anything gets worse or changes  Take care,  Inda Coke PA-C

## 2022-09-01 LAB — URINE CULTURE
MICRO NUMBER:: 14749395
SPECIMEN QUALITY:: ADEQUATE

## 2022-09-06 ENCOUNTER — Encounter: Payer: Self-pay | Admitting: Physician Assistant

## 2022-09-06 MED ORDER — METFORMIN HCL 500 MG PO TABS: ORAL_TABLET | ORAL | 1 refills | Status: DC

## 2022-09-07 ENCOUNTER — Ambulatory Visit (INDEPENDENT_AMBULATORY_CARE_PROVIDER_SITE_OTHER): Payer: Managed Care, Other (non HMO)

## 2022-09-07 DIAGNOSIS — E538 Deficiency of other specified B group vitamins: Secondary | ICD-10-CM | POA: Diagnosis not present

## 2022-09-07 MED ORDER — CYANOCOBALAMIN 1000 MCG/ML IJ SOLN
1000.0000 ug | Freq: Once | INTRAMUSCULAR | Status: AC
  Administered 2022-09-07: 1000 ug via INTRAMUSCULAR

## 2022-09-07 NOTE — Progress Notes (Signed)
Daniel Davenport 37 yr old male presents to office today for 1st of 4 weekly B12 injections per Inda Coke, [place]. Administered CYANOCOBALAMIN 1,000 mcg IM right arm. Patient tolerated well.

## 2022-09-09 ENCOUNTER — Ambulatory Visit: Payer: Managed Care, Other (non HMO) | Admitting: Physician Assistant

## 2022-09-14 ENCOUNTER — Encounter: Payer: Self-pay | Admitting: *Deleted

## 2022-09-14 ENCOUNTER — Ambulatory Visit: Payer: Managed Care, Other (non HMO) | Admitting: *Deleted

## 2022-09-14 DIAGNOSIS — E538 Deficiency of other specified B group vitamins: Secondary | ICD-10-CM | POA: Diagnosis not present

## 2022-09-14 MED ORDER — CYANOCOBALAMIN 1000 MCG/ML IJ SOLN
1000.0000 ug | Freq: Once | INTRAMUSCULAR | Status: AC
  Administered 2022-09-14: 1000 ug via INTRAMUSCULAR

## 2022-09-14 NOTE — Progress Notes (Signed)
Per orders of Samantha Worley, PA, injection of Cyanocobalamin 1000 mcg/ml  given IM by Jeanett Antonopoulos, LPN in left deltoid. Patient tolerated injection well. Patient will make appointment for 1 week.  

## 2022-09-15 ENCOUNTER — Ambulatory Visit: Payer: Managed Care, Other (non HMO) | Admitting: Physician Assistant

## 2022-09-21 ENCOUNTER — Ambulatory Visit (INDEPENDENT_AMBULATORY_CARE_PROVIDER_SITE_OTHER): Payer: Managed Care, Other (non HMO)

## 2022-09-21 DIAGNOSIS — E538 Deficiency of other specified B group vitamins: Secondary | ICD-10-CM

## 2022-09-21 MED ORDER — CYANOCOBALAMIN 1000 MCG/ML IJ SOLN
1000.0000 ug | Freq: Once | INTRAMUSCULAR | Status: AC
  Administered 2022-09-21: 1000 ug via INTRAMUSCULAR

## 2022-09-21 NOTE — Progress Notes (Signed)
Pt here for B12 injection for Samantha Worley, PA. Injection given in RIGHT deltoid. Pt tolerated well.   

## 2022-12-13 ENCOUNTER — Ambulatory Visit: Payer: Managed Care, Other (non HMO) | Admitting: Physician Assistant

## 2022-12-13 ENCOUNTER — Encounter: Payer: Self-pay | Admitting: Physician Assistant

## 2022-12-13 VITALS — BP 120/80 | HR 101 | Temp 98.2°F | Ht 71.5 in | Wt 194.0 lb

## 2022-12-13 DIAGNOSIS — Z7984 Long term (current) use of oral hypoglycemic drugs: Secondary | ICD-10-CM | POA: Diagnosis not present

## 2022-12-13 DIAGNOSIS — R42 Dizziness and giddiness: Secondary | ICD-10-CM

## 2022-12-13 DIAGNOSIS — E559 Vitamin D deficiency, unspecified: Secondary | ICD-10-CM | POA: Diagnosis not present

## 2022-12-13 DIAGNOSIS — E538 Deficiency of other specified B group vitamins: Secondary | ICD-10-CM | POA: Diagnosis not present

## 2022-12-13 DIAGNOSIS — E785 Hyperlipidemia, unspecified: Secondary | ICD-10-CM | POA: Diagnosis not present

## 2022-12-13 DIAGNOSIS — R079 Chest pain, unspecified: Secondary | ICD-10-CM | POA: Diagnosis not present

## 2022-12-13 DIAGNOSIS — R0781 Pleurodynia: Secondary | ICD-10-CM

## 2022-12-13 DIAGNOSIS — E119 Type 2 diabetes mellitus without complications: Secondary | ICD-10-CM

## 2022-12-13 LAB — VITAMIN D 25 HYDROXY (VIT D DEFICIENCY, FRACTURES): VITD: 21.29 ng/mL — ABNORMAL LOW (ref 30.00–100.00)

## 2022-12-13 LAB — LIPID PANEL
Cholesterol: 164 mg/dL (ref 0–200)
HDL: 42.4 mg/dL (ref >39.00)
LDL Cholesterol: 95 mg/dL (ref 0–99)
NonHDL: 121.42
Total CHOL/HDL Ratio: 4
Triglycerides: 134 mg/dL (ref 0.0–149.0)
VLDL: 26.8 mg/dL (ref 0.0–40.0)

## 2022-12-13 LAB — COMPREHENSIVE METABOLIC PANEL
ALT: 48 U/L (ref 0–53)
AST: 25 U/L (ref 0–37)
Albumin: 4.5 g/dL (ref 3.5–5.2)
Alkaline Phosphatase: 74 U/L (ref 39–117)
BUN: 6 mg/dL (ref 6–23)
CO2: 25 mEq/L (ref 19–32)
Calcium: 9.9 mg/dL (ref 8.4–10.5)
Chloride: 103 mEq/L (ref 96–112)
Creatinine, Ser: 0.74 mg/dL (ref 0.40–1.50)
GFR: 117.41 mL/min (ref >60.00)
Glucose, Bld: 149 mg/dL — ABNORMAL HIGH (ref 70–99)
Potassium: 3.8 mEq/L (ref 3.5–5.1)
Sodium: 139 mEq/L (ref 135–145)
Total Bilirubin: 0.4 mg/dL (ref 0.2–1.2)
Total Protein: 6.9 g/dL (ref 6.0–8.3)

## 2022-12-13 LAB — CBC
HCT: 41.5 % (ref 39.0–52.0)
Hemoglobin: 14 g/dL (ref 13.0–17.0)
MCHC: 33.7 g/dL (ref 30.0–36.0)
MCV: 80.4 fl (ref 78.0–100.0)
Platelets: 314 10*3/uL (ref 150.0–400.0)
RBC: 5.16 Mil/uL (ref 4.22–5.81)
RDW: 13.2 % (ref 11.5–15.5)
WBC: 6.8 10*3/uL (ref 4.0–10.5)

## 2022-12-13 LAB — HEMOGLOBIN A1C: Hgb A1c MFr Bld: 6.1 % (ref 4.6–6.5)

## 2022-12-13 LAB — MICROALBUMIN / CREATININE URINE RATIO
Creatinine,U: 91.1 mg/dL
Microalb Creat Ratio: 0.8 mg/g (ref 0.0–30.0)
Microalb, Ur: <0.7 mg/dL (ref 0.0–1.9)

## 2022-12-13 LAB — VITAMIN B12: Vitamin B-12: 322 pg/mL (ref 211–911)

## 2022-12-13 LAB — TSH: TSH: 1.94 u[IU]/mL (ref 0.35–5.50)

## 2022-12-13 NOTE — Progress Notes (Addendum)
Daniel Davenport is a 37 y.o. male here for a new problem.  History of Present Illness:   Chief Complaint  Patient presents with   c/o rib pain    Pt c/o pain left lower side of rib cage x 7-10 days ago. Has not tried any medication.    HPI  Rib pain:  He complains of constant left lower rib pain for the past 10 days.  His pain started after he started doing crunch exercises and stopped them Monday, 12/05/2022.  He is applying topical creams to manage his pain and finds no change.  He denies constipation or bruising around the area.   Chest pain:  He also has occasional left chest pains and left arm pains.  His pain started after he started weight lifting exercises at home.  His blood pressure typically reads 100/80 and his oxygen levels are normal.  He denies recent cough or fever.   Headache:  He also complains of headaches causing him to lose focus a couple days ago  He has light and sound sensitivity and vision changes during headaches.  His headaches cause him to lose focus and causes him difficulty completing work.  He denies nausea, memory issues or difficulty sleeping.  He has taken aspirin once to manage his headache, otherwise he is not taking medication to manage his symptoms.  He drinks a 1-2 cups of coffee daily and 6-7 glasses of water daily.  He maintains consistent and regular diet at home.   HLD: He continues taking 10 mg Lipitor daily PO every night and reports no new issues while taking it.  Lab Results  Component Value Date   CHOL 165 04/28/2021   HDL 47.90 04/28/2021   LDLCALC 96 04/28/2021   TRIG 105.0 04/28/2021   CHOLHDL 3 04/28/2021   DM He continues taking 1000 mg metformin every morning and 500 mg every night and reports no new issues while taking it.  Lab Results  Component Value Date   HGBA1C 6.6 (H) 08/31/2022   Family/Social history: He does not drink alcohol.  His grandfather has a history of heart attacks.    Past Medical History:   Diagnosis Date   Diabetes mellitus without complication (HCC)    Hyperlipidemia      Social History   Tobacco Use   Smoking status: Former   Smokeless tobacco: Never  Building services engineer Use: Never used  Substance Use Topics   Alcohol use: Yes    Comment: socially   Drug use: Never    No past surgical history on file.  Family History  Problem Relation Age of Onset   Diabetes Maternal Grandfather    Diabetes Paternal Grandfather    Heart disease Paternal Grandfather    Esophageal cancer Maternal Uncle    Prostate cancer Neg Hx    Colon cancer Neg Hx     No Known Allergies  Current Medications:   Current Outpatient Medications:    atorvastatin (LIPITOR) 10 MG tablet, TAKE 1 TABLET BY MOUTH EVERY DAY, Disp: 90 tablet, Rfl: 1   metFORMIN (GLUCOPHAGE) 500 MG tablet, TAKE 2 TABLETS (1000 MG) BY MOUTH EVERY MORNING AND 1 TABLET (500 MG) EVERY EVENING., Disp: 270 tablet, Rfl: 1   triamcinolone (KENALOG) 0.025 % cream, Apply to affected area 1-2 times daily, Disp: 454 g, Rfl: 0   Review of Systems:   Review of Systems  Constitutional:  Negative for fever.  HENT:         (+)sound sensitivity  at during headache  Eyes:  Positive for photophobia (during headaches).  Respiratory:  Negative for cough.   Cardiovascular:  Positive for chest pain.  Gastrointestinal:  Negative for constipation and nausea (during headaches).  Musculoskeletal:        (+)lower left rib pain (+)left upper arm man  Neurological:  Positive for headaches (causing him to lose focus).  Endo/Heme/Allergies:        (-)bruising around left ribs  Psychiatric/Behavioral:  Negative for memory loss (during headache).        (+)easily lose focus during headaches    Vitals:   Vitals:   12/13/22 1259  BP: 120/80  Pulse: (!) 101  Temp: 98.2 F (36.8 C)  TempSrc: Temporal  SpO2: 97%  Weight: 194 lb (88 kg)  Height: 5' 11.5" (1.816 m)     Body mass index is 26.68 kg/m.  Orthostatic Vitals for the  past 48 hrs (Last 6 readings):  Patient Position BP Pulse BP Location Cuff Size Patient Position (if appropriate) BP- Standing at 0 minutes Pulse- Standing at 0 minutes BP- Sitting Pulse- Sitting BP- Lying Pulse- Lying  12/13/22 1259 Sitting 120/80 (!) 101 Left Arm Normal -- -- -- -- -- -- --  12/13/22 1335 -- -- -- -- -- Orthostatic Vitals (!) 140/96 98 130/90 98 (!) 142/96 89      Physical Exam:   Physical Exam Vitals and nursing note reviewed.  Constitutional:      General: He is not in acute distress.    Appearance: He is well-developed. He is not ill-appearing or toxic-appearing.  Cardiovascular:     Rate and Rhythm: Normal rate and regular rhythm.     Pulses: Normal pulses.     Heart sounds: Normal heart sounds, S1 normal and S2 normal.  Pulmonary:     Effort: Pulmonary effort is normal.     Breath sounds: Normal breath sounds.  Chest:    Musculoskeletal:     Comments: Tenderness to palpation to anterior lower left rib cage; no obvious deformity  Skin:    General: Skin is warm and dry.  Neurological:     Mental Status: He is alert.     GCS: GCS eye subscore is 4. GCS verbal subscore is 5. GCS motor subscore is 6.  Psychiatric:        Speech: Speech normal.        Behavior: Behavior normal. Behavior is cooperative.     Assessment and Plan:   Chest pain, unspecified type EKG tracing is personally reviewed.  EKG notes NSR.  No acute changes.  Suspect musculoskeletal-related as reproducible with palpation He would like referral to cardiology -- placed today If new/worsening symptom(s), will place  B12 deficiency Update B12 and advise accordingly -- he did well with B12 injections and would like to continue them if needed  Diabetes mellitus without complication (HCC) Update A1c and adjust metformin 1500 mg daily as indicated Follow-up in 3-6 month(s), based on results Update urine screening today as well  Vitamin D deficiency Update Vitamin D and provide  recommendations accordingly  Hyperlipidemia, unspecified hyperlipidemia type Update lipid panel and adjust Lipitor 10 mg daily as indicated  Lightheadedness Orthostatics are negative Update blood work to rule out organic cause Push fluids Referral to cardiology per patient request If new/worsening symptom(s), I asked him to let me know  Rib pain No red flags Recommend cessation of exercise Recommend anti-inflammatories to help reduce pain If no response to anti-inflammatories, or if any worsening symptom(s), will  refer to sports medicine vs obtain xray    [employer] as a scribe for [employer], PA.,have documented all relevant documentation on the behalf of Jarold Motto, PA,as directed by  Jarold Motto, PA while in the presence of Jarold Motto, [place].  I, Jarold Motto, [place], have reviewed all documentation for this visit. The documentation on 12/13/22 for the exam, diagnosis, procedures, and orders are all accurate and complete.  I spent a total of 44 minutes on this visit, today 12/13/22, which obtaining history, ordering tests, discussing plan of care with patient and using shared-decision making on next steps,  and documenting the findings in the note.   Jarold Motto, PA-C

## 2022-12-13 NOTE — Patient Instructions (Signed)
It was great to see you!  We will update blood work and urine studies today  Your EKG was overall normal  Keep me posted on your symptom(s)   Take care,  Jarold Motto PA-C

## 2023-03-17 ENCOUNTER — Ambulatory Visit: Payer: Managed Care, Other (non HMO) | Admitting: Cardiovascular Disease

## 2023-03-23 ENCOUNTER — Other Ambulatory Visit: Payer: Self-pay | Admitting: Physician Assistant

## 2023-05-15 NOTE — Progress Notes (Incomplete)
Daniel Davenport is a 37 y.o. male here for a new problem. History of Present Illness:   Health Maintenance Due  Topic Date Due   INFLUENZA VACCINE  01/05/2023   No chief complaint on file.  HPI Abdominal Pain: {ELStarters:31163}         ***  ***  ***  ***  *** Past Medical History:  Diagnosis Date   Abdominal pain    Chest pain    Diabetes mellitus without complication (HCC)    Elevated LFTs    Hyperlipidemia    Lightheadedness    Low back pain    Rib pain    Vitamin B 12 deficiency    Vitamin D deficiency     Social History   Tobacco Use   Smoking status: Former   Smokeless tobacco: Never  Vaping Use   Vaping status: Never Used  Substance Use Topics   Alcohol use: Yes    Comment: socially   Drug use: Never   No past surgical history on file. Family History  Problem Relation Age of Onset   Diabetes Maternal Grandfather    Diabetes Paternal Grandfather    Heart disease Paternal Grandfather    Esophageal cancer Maternal Uncle    Prostate cancer Neg Hx    Colon cancer Neg Hx    No Known Allergies Current Medications:   Current Outpatient Medications:    atorvastatin (LIPITOR) 10 MG tablet, TAKE 1 TABLET BY MOUTH EVERY DAY, Disp: 90 tablet, Rfl: 1   metFORMIN (GLUCOPHAGE) 500 MG tablet, TAKE 2 TABLETS (1000 MG) BY MOUTH EVERY MORNING AND 1 TABLET (500 MG) EVERY EVENING., Disp: 270 tablet, Rfl: 1   triamcinolone (KENALOG) 0.025 % cream, Apply to affected area 1-2 times daily, Disp: 454 g, Rfl: 0  Review of Systems:   ROS See pertinent positives and negatives as per the HPI.  Vitals:   There were no vitals filed for this visit.   There is no height or weight on file to calculate BMI.  Physical Exam:   Physical Exam  Assessment and Plan:   There are no diagnoses linked to this encounter.            I,Emily Lagle,acting as a Neurosurgeon for [employer], PA.,have documented all relevant documentation on the behalf of Jarold Motto, PA,as directed by  Jarold Motto, PA while in the presence of Jarold Motto, [place].  *** (refresh reminder)  I, Jarold Motto, PA, have reviewed all documentation for this visit. The documentation on 05/15/23 for the exam, diagnosis, procedures, and orders are all accurate and complete.  Jarold Motto, PA-C

## 2023-05-15 NOTE — Progress Notes (Shared)
Daniel Davenport is a 37 y.o. male here for a new problem.  History of Present Illness:   No chief complaint on file.   HPI  Abdominal Pain Reports experiencing RLQ abdominal pain since    Past Medical History:  Diagnosis Date   Abdominal pain    Chest pain    Diabetes mellitus without complication (HCC)    Elevated LFTs    Hyperlipidemia    Lightheadedness    Low back pain    Rib pain    Vitamin B 12 deficiency    Vitamin D deficiency      Social History   Tobacco Use   Smoking status: Former   Smokeless tobacco: Never  Vaping Use   Vaping status: Never Used  Substance Use Topics   Alcohol use: Yes    Comment: socially   Drug use: Never    No past surgical history on file.  Family History  Problem Relation Age of Onset   Diabetes Maternal Grandfather    Diabetes Paternal Grandfather    Heart disease Paternal Grandfather    Esophageal cancer Maternal Uncle    Prostate cancer Neg Hx    Colon cancer Neg Hx     No Known Allergies  Current Medications:   Current Outpatient Medications:    atorvastatin (LIPITOR) 10 MG tablet, TAKE 1 TABLET BY MOUTH EVERY DAY, Disp: 90 tablet, Rfl: 1   metFORMIN (GLUCOPHAGE) 500 MG tablet, TAKE 2 TABLETS (1000 MG) BY MOUTH EVERY MORNING AND 1 TABLET (500 MG) EVERY EVENING., Disp: 270 tablet, Rfl: 1   triamcinolone (KENALOG) 0.025 % cream, Apply to affected area 1-2 times daily, Disp: 454 g, Rfl: 0   Review of Systems:   ROS  Vitals:   There were no vitals filed for this visit.   There is no height or weight on file to calculate BMI.  Physical Exam:   Physical Exam  Assessment and Plan:   ***   I,Alexander Ruley,acting as a scribe for Jarold Motto, PA.,have documented all relevant documentation on the behalf of Jarold Motto, PA,as directed by  Jarold Motto, PA while in the presence of Jarold Motto, [place].   ***  Jarold Motto, PA-C

## 2023-05-16 ENCOUNTER — Ambulatory Visit: Payer: Managed Care, Other (non HMO) | Admitting: Physician Assistant

## 2023-05-16 ENCOUNTER — Ambulatory Visit: Payer: Managed Care, Other (non HMO) | Admitting: Cardiovascular Disease

## 2023-05-16 NOTE — Progress Notes (Signed)
Daniel Davenport is a 37 y.o. male here for a new problem.  History of Present Illness:   No chief complaint on file.   HPI  Abdominal Pain    Past Medical History:  Diagnosis Date   Abdominal pain    Chest pain    Diabetes mellitus without complication (HCC)    Elevated LFTs    Hyperlipidemia    Lightheadedness    Low back pain    Rib pain    Vitamin B 12 deficiency    Vitamin D deficiency      Social History   Tobacco Use   Smoking status: Former   Smokeless tobacco: Never  Vaping Use   Vaping status: Never Used  Substance Use Topics   Alcohol use: Yes    Comment: socially   Drug use: Never    No past surgical history on file.  Family History  Problem Relation Age of Onset   Diabetes Maternal Grandfather    Diabetes Paternal Grandfather    Heart disease Paternal Grandfather    Esophageal cancer Maternal Uncle    Prostate cancer Neg Hx    Colon cancer Neg Hx     No Known Allergies  Current Medications:   Current Outpatient Medications:    atorvastatin (LIPITOR) 10 MG tablet, TAKE 1 TABLET BY MOUTH EVERY DAY, Disp: 90 tablet, Rfl: 1   metFORMIN (GLUCOPHAGE) 500 MG tablet, TAKE 2 TABLETS (1000 MG) BY MOUTH EVERY MORNING AND 1 TABLET (500 MG) EVERY EVENING., Disp: 270 tablet, Rfl: 1   triamcinolone (KENALOG) 0.025 % cream, Apply to affected area 1-2 times daily, Disp: 454 g, Rfl: 0   Review of Systems:   ROS  Vitals:   There were no vitals filed for this visit.   There is no height or weight on file to calculate BMI.  Physical Exam:   Physical Exam  Assessment and Plan:   ***   I,Alexander Ruley,acting as a scribe for Jarold Motto, PA.,have documented all relevant documentation on the behalf of Jarold Motto, PA,as directed by  Jarold Motto, PA while in the presence of Jarold Motto, [place].   ***  Jarold Motto, PA-C

## 2023-05-17 ENCOUNTER — Ambulatory Visit: Payer: Managed Care, Other (non HMO) | Admitting: Physician Assistant

## 2023-05-17 ENCOUNTER — Ambulatory Visit (INDEPENDENT_AMBULATORY_CARE_PROVIDER_SITE_OTHER): Payer: Managed Care, Other (non HMO) | Admitting: Physician Assistant

## 2023-05-17 ENCOUNTER — Encounter: Payer: Self-pay | Admitting: Physician Assistant

## 2023-05-17 VITALS — BP 118/80 | HR 90 | Temp 97.5°F | Ht 71.5 in | Wt 192.5 lb

## 2023-05-17 DIAGNOSIS — Z7984 Long term (current) use of oral hypoglycemic drugs: Secondary | ICD-10-CM | POA: Diagnosis not present

## 2023-05-17 DIAGNOSIS — R1084 Generalized abdominal pain: Secondary | ICD-10-CM | POA: Diagnosis not present

## 2023-05-17 DIAGNOSIS — E119 Type 2 diabetes mellitus without complications: Secondary | ICD-10-CM

## 2023-05-17 NOTE — Patient Instructions (Signed)
It was great to see you!  We will get blood work, urine and stool tests  Lets plan for CT scan tomorrow -- we will call tomorrow to confirm that you want to proceed with this  If any WORSENING symptom(s), go to the ER  Take care,  Jarold Motto PA-C

## 2023-05-18 ENCOUNTER — Telehealth: Payer: Self-pay | Admitting: *Deleted

## 2023-05-18 ENCOUNTER — Ambulatory Visit (HOSPITAL_BASED_OUTPATIENT_CLINIC_OR_DEPARTMENT_OTHER)
Admission: RE | Admit: 2023-05-18 | Discharge: 2023-05-18 | Disposition: A | Discharge status: Home / Self Care | Payer: Managed Care, Other (non HMO) | Source: Ambulatory Visit | Attending: Physician Assistant | Admitting: Physician Assistant

## 2023-05-18 DIAGNOSIS — R1084 Generalized abdominal pain: Secondary | ICD-10-CM | POA: Diagnosis present

## 2023-05-18 LAB — COMPREHENSIVE METABOLIC PANEL
ALT: 75 U/L — ABNORMAL HIGH (ref 0–53)
AST: 32 U/L (ref 0–37)
Albumin: 4.3 g/dL (ref 3.5–5.2)
Alkaline Phosphatase: 69 U/L (ref 39–117)
BUN: 9 mg/dL (ref 6–23)
CO2: 24 meq/L (ref 19–32)
Calcium: 9.5 mg/dL (ref 8.4–10.5)
Chloride: 105 meq/L (ref 96–112)
Creatinine, Ser: 0.72 mg/dL (ref 0.40–1.50)
GFR: 118.04 mL/min (ref >60.00)
Glucose, Bld: 161 mg/dL — ABNORMAL HIGH (ref 70–99)
Potassium: 4 meq/L (ref 3.5–5.1)
Sodium: 139 meq/L (ref 135–145)
Total Bilirubin: 0.6 mg/dL (ref 0.2–1.2)
Total Protein: 7 g/dL (ref 6.0–8.3)

## 2023-05-18 LAB — CBC WITH DIFFERENTIAL/PLATELET
Basophils Absolute: 0.1 10*3/uL (ref 0.0–0.1)
Basophils Relative: 1.1 % (ref 0.0–3.0)
Eosinophils Absolute: 0.1 10*3/uL (ref 0.0–0.7)
Eosinophils Relative: 0.7 % (ref 0.0–5.0)
HCT: 40.2 % (ref 39.0–52.0)
Hemoglobin: 13.9 g/dL (ref 13.0–17.0)
Lymphocytes Relative: 22.1 % (ref 12.0–46.0)
Lymphs Abs: 1.9 10*3/uL (ref 0.7–4.0)
MCHC: 34.6 g/dL (ref 30.0–36.0)
MCV: 80.9 fL (ref 78.0–100.0)
Monocytes Absolute: 0.4 10*3/uL (ref 0.1–1.0)
Monocytes Relative: 5 % (ref 3.0–12.0)
Neutro Abs: 6 10*3/uL (ref 1.4–7.7)
Neutrophils Relative %: 71.1 % (ref 43.0–77.0)
Platelets: 352 10*3/uL (ref 150.0–400.0)
RBC: 4.97 Mil/uL (ref 4.22–5.81)
RDW: 13.5 % (ref 11.5–15.5)
WBC: 8.4 10*3/uL (ref 4.0–10.5)

## 2023-05-18 LAB — URINALYSIS, ROUTINE W REFLEX MICROSCOPIC
Bilirubin Urine: NEGATIVE
Hgb urine dipstick: NEGATIVE
Ketones, ur: NEGATIVE
Leukocytes,Ua: NEGATIVE
Nitrite: NEGATIVE
RBC / HPF: NONE SEEN (ref 0–?)
Specific Gravity, Urine: 1.01 (ref 1.000–1.030)
Total Protein, Urine: NEGATIVE
Urine Glucose: NEGATIVE
Urobilinogen, UA: 0.2 (ref 0.0–1.0)
WBC, UA: NONE SEEN (ref 0–?)
pH: 7 (ref 5.0–8.0)

## 2023-05-18 LAB — HEMOGLOBIN A1C: Hgb A1c MFr Bld: 6.4 % (ref 4.6–6.5)

## 2023-05-18 LAB — LIPASE: Lipase: 53 U/L (ref 11.0–59.0)

## 2023-05-18 MED ORDER — IOHEXOL 300 MG/ML  SOLN
100.0000 mL | Freq: Once | INTRAMUSCULAR | Status: AC | PRN
  Administered 2023-05-18: 85 mL via INTRAVENOUS

## 2023-05-18 NOTE — Telephone Encounter (Signed)
Pt called back told her CT scan was approved do you want to move forward and have done? Pt said yes. Told him okay someone will contact to schedule CT scan. Pt verbalized understanding.

## 2023-05-18 NOTE — Telephone Encounter (Signed)
Left message on voicemail to call office.  

## 2023-05-19 ENCOUNTER — Encounter: Payer: Self-pay | Admitting: Physician Assistant

## 2023-05-19 ENCOUNTER — Other Ambulatory Visit: Payer: Self-pay | Admitting: Physician Assistant

## 2023-05-19 DIAGNOSIS — R7989 Other specified abnormal findings of blood chemistry: Secondary | ICD-10-CM

## 2023-05-19 NOTE — Telephone Encounter (Signed)
Please see pt response.

## 2023-05-21 LAB — GI PROFILE, STOOL, PCR
Adenovirus F 40/41: NOT DETECTED
Astrovirus: NOT DETECTED
C difficile toxin A/B: NOT DETECTED
Campylobacter: NOT DETECTED
Cryptosporidium: NOT DETECTED
Cyclospora cayetanensis: NOT DETECTED
E coli O157: NOT DETECTED
Entamoeba histolytica: NOT DETECTED
Enteroaggregative E coli: DETECTED — AB
Enterotoxigenic E coli: NOT DETECTED
Giardia lamblia: DETECTED — AB
Norovirus GI/GII: NOT DETECTED
Plesiomonas shigelloides: NOT DETECTED
Rotavirus A: NOT DETECTED
Salmonella: NOT DETECTED
Sapovirus: NOT DETECTED
Shiga-toxin-producing E coli: DETECTED — AB
Shigella/Enteroinvasive E coli: NOT DETECTED
Vibrio cholerae: NOT DETECTED
Vibrio: NOT DETECTED
Yersinia enterocolitica: NOT DETECTED

## 2023-05-22 ENCOUNTER — Other Ambulatory Visit: Payer: Self-pay | Admitting: Physician Assistant

## 2023-05-22 MED ORDER — TINIDAZOLE 500 MG PO TABS: 2.0000 g | ORAL_TABLET | Freq: Once | ORAL | 0 refills | Status: AC

## 2023-05-24 LAB — URINE CULTURE

## 2023-05-24 LAB — H. PYLORI BREATH TEST: H. pylori Breath Test: NOT DETECTED

## 2023-06-02 ENCOUNTER — Emergency Department (HOSPITAL_BASED_OUTPATIENT_CLINIC_OR_DEPARTMENT_OTHER): Payer: Managed Care, Other (non HMO)

## 2023-06-02 ENCOUNTER — Other Ambulatory Visit: Payer: Self-pay

## 2023-06-02 ENCOUNTER — Emergency Department (HOSPITAL_BASED_OUTPATIENT_CLINIC_OR_DEPARTMENT_OTHER)
Admission: EM | Admit: 2023-06-02 | Discharge: 2023-06-02 | Disposition: A | Discharge status: Home / Self Care | Payer: Managed Care, Other (non HMO) | Attending: Emergency Medicine | Admitting: Emergency Medicine

## 2023-06-02 DIAGNOSIS — R1011 Right upper quadrant pain: Secondary | ICD-10-CM | POA: Insufficient documentation

## 2023-06-02 DIAGNOSIS — R1031 Right lower quadrant pain: Secondary | ICD-10-CM | POA: Insufficient documentation

## 2023-06-02 DIAGNOSIS — R109 Unspecified abdominal pain: Secondary | ICD-10-CM

## 2023-06-02 LAB — COMPREHENSIVE METABOLIC PANEL
ALT: 77 U/L — ABNORMAL HIGH (ref 0–44)
AST: 36 U/L (ref 15–41)
Albumin: 4.3 g/dL (ref 3.5–5.0)
Alkaline Phosphatase: 83 U/L (ref 38–126)
Anion gap: 7 (ref 5–15)
BUN: 10 mg/dL (ref 6–20)
CO2: 29 mmol/L (ref 22–32)
Calcium: 9.8 mg/dL (ref 8.9–10.3)
Chloride: 103 mmol/L (ref 98–111)
Creatinine, Ser: 0.71 mg/dL (ref 0.61–1.24)
GFR, Estimated: >60 mL/min (ref >60)
Glucose, Bld: 160 mg/dL — ABNORMAL HIGH (ref 70–99)
Potassium: 4.1 mmol/L (ref 3.5–5.1)
Sodium: 139 mmol/L (ref 135–145)
Total Bilirubin: 0.6 mg/dL (ref <1.2)
Total Protein: 7.5 g/dL (ref 6.5–8.1)

## 2023-06-02 LAB — URINALYSIS, ROUTINE W REFLEX MICROSCOPIC
Bilirubin Urine: NEGATIVE
Glucose, UA: NEGATIVE mg/dL
Hgb urine dipstick: NEGATIVE
Ketones, ur: NEGATIVE mg/dL
Leukocytes,Ua: NEGATIVE
Nitrite: NEGATIVE
Protein, ur: NEGATIVE mg/dL
Specific Gravity, Urine: 1.021 (ref 1.005–1.030)
pH: 7.5 (ref 5.0–8.0)

## 2023-06-02 LAB — CBC
HCT: 41.2 % (ref 39.0–52.0)
Hemoglobin: 14.1 g/dL (ref 13.0–17.0)
MCH: 27.7 pg (ref 26.0–34.0)
MCHC: 34.2 g/dL (ref 30.0–36.0)
MCV: 80.9 fL (ref 80.0–100.0)
Platelets: 293 10*3/uL (ref 150–400)
RBC: 5.09 MIL/uL (ref 4.22–5.81)
RDW: 12.8 % (ref 11.5–15.5)
WBC: 6.3 10*3/uL (ref 4.0–10.5)
nRBC: 0 % (ref 0.0–0.2)

## 2023-06-02 LAB — C DIFFICILE QUICK SCREEN W PCR REFLEX
C Diff antigen: NEGATIVE
C Diff interpretation: NOT DETECTED
C Diff toxin: NEGATIVE

## 2023-06-02 LAB — LIPASE, BLOOD: Lipase: 31 U/L (ref 11–51)

## 2023-06-02 MED ORDER — IOHEXOL 300 MG/ML  SOLN
100.0000 mL | Freq: Once | INTRAMUSCULAR | Status: AC | PRN
  Administered 2023-06-02: 100 mL via INTRAVENOUS

## 2023-06-02 NOTE — ED Notes (Signed)
Pt aware of need for stool sample, pt unable to provide at this time.

## 2023-06-02 NOTE — Discharge Instructions (Signed)
I will call you first thing tomorrow morning when your PCR results are back for your stool sample provided today.  If needed, I will order prescriptions over the phone to your pharmacy for you.  Otherwise please follow with your primary care physician.

## 2023-06-02 NOTE — ED Provider Notes (Signed)
[place] EMERGENCY DEPARTMENT AT [facility] Provider Note   CSN: [id number] Arrival date & time: 06/02/23  1400     History {Add pertinent medical, surgical, social history, OB history to HPI:1} Chief Complaint  Patient presents with   Abdominal Pain    Daniel Davenport is a 37 y.o. male.  Patient is a 37 yo male presenting for rlq abdominal pain. Pt had hx of rlq abdominal pain and diarrhea earlier this month and had positive stool pcr on 05/18/23 for giardia, shiga-toxin e.coli, and enteroaggregative E coli. He was treated with Tinidazole 500 mg (4 tablets) x 1 dose 10 days ago and has improvement of diarrhea but worsening of rlq abdominal pain that now radiates to the ruq as well. Denies fevers, chills, nausea, or vomiting. Denies black or bloody stools.   The history is provided by the patient. No language interpreter was used.  Abdominal Pain Associated symptoms: no chest pain, no chills, no cough, no diarrhea, no dysuria, no fever, no hematuria, no nausea, no shortness of breath, no sore throat and no vomiting        Home Medications Prior to Admission medications   Medication Sig Start Date End Date Taking? Authorizing Provider  atorvastatin (LIPITOR) 10 MG tablet TAKE 1 TABLET BY MOUTH EVERY DAY 03/23/23   Jarold Motto, PA  metFORMIN (GLUCOPHAGE) 500 MG tablet TAKE 2 TABLETS (1000 MG) BY MOUTH EVERY MORNING AND 1 TABLET (500 MG) EVERY EVENING. 03/23/23   Jarold Motto, PA      Allergies    Patient has no known allergies.    Review of Systems   Review of Systems  Constitutional:  Negative for chills and fever.  HENT:  Negative for ear pain and sore throat.   Eyes:  Negative for pain and visual disturbance.  Respiratory:  Negative for cough and shortness of breath.   Cardiovascular:  Negative for chest pain and palpitations.  Gastrointestinal:  Positive for abdominal pain. Negative for blood in stool, diarrhea, nausea and vomiting.  Genitourinary:   Negative for dysuria and hematuria.  Musculoskeletal:  Negative for arthralgias and back pain.  Skin:  Negative for color change and rash.  Neurological:  Negative for seizures and syncope.  All other systems reviewed and are negative.   Physical Exam Updated Vital Signs BP 138/86   Pulse 97   Temp 98.2 F (36.8 C)   Resp 16   Ht 5' 11.5" (1.816 m)   Wt 86.2 kg   SpO2 99%   BMI 26.13 kg/m  Physical Exam Vitals and nursing note reviewed.  Constitutional:      General: He is not in acute distress.    Appearance: He is well-developed.  HENT:     Head: Normocephalic and atraumatic.  Eyes:     Conjunctiva/sclera: Conjunctivae normal.  Cardiovascular:     Rate and Rhythm: Normal rate and regular rhythm.     Heart sounds: No murmur heard. Pulmonary:     Effort: Pulmonary effort is normal. No respiratory distress.     Breath sounds: Normal breath sounds.  Abdominal:     Palpations: Abdomen is soft.     Tenderness: There is abdominal tenderness in the right upper quadrant and right lower quadrant. There is no guarding or rebound. Negative signs include Murphy's sign and Rovsing's sign.  Musculoskeletal:        General: No swelling.     Cervical back: Neck supple.  Skin:    General: Skin is warm and dry.  Capillary Refill: Capillary refill takes less than 2 seconds.  Neurological:     Mental Status: He is alert.  Psychiatric:        Mood and Affect: Mood normal.     ED Results / Procedures / Treatments   Labs (all labs ordered are listed, but only abnormal results are displayed) Labs Reviewed  COMPREHENSIVE METABOLIC PANEL - Abnormal; Notable for the following components:      Result Value   Glucose, Bld 160 (*)    ALT 77 (*)    All other components within normal limits  C DIFFICILE QUICK SCREEN W PCR REFLEX    LIPASE, BLOOD  CBC  URINALYSIS, ROUTINE W REFLEX MICROSCOPIC    EKG None  Radiology No results found.  Procedures Procedures  {Document  cardiac monitor, telemetry assessment procedure when appropriate:1}  Medications Ordered in ED Medications - No data to display  ED Course/ Medical Decision Making/ A&P   {   Click here for ABCD2, HEART and other calculatorsREFRESH Note before signing :1}                              Medical Decision Making Amount and/or Complexity of Data Reviewed Labs: ordered. Radiology: ordered.    37 yo male presenting for rlq abdominal pain. Pt had hx of rlq abdominal pain and diarrhea earlier this month and had positive stool pcr on 05/18/23 for giardia, shiga-toxin e.coli, and enteroaggregative E coli. He was treated with Tinidazole 500 mg (4 tablets) x 1 dose 10 days ago and has improvement of diarrhea but worsening of rlq abdominal pain that now radiates to the ruq as well. Denies fevers, chills, nausea, or vomiting. Denies black or bloody stools.   {Document critical care time when appropriate:1} {Document review of labs and clinical decision tools ie heart score, Chads2Vasc2 etc:1}  {Document your independent review of radiology images, and any outside records:1} {Document your discussion with family members, caretakers, and with consultants:1} {Document social determinants of health affecting pt's care:1} {Document your decision making why or why not admission, treatments were needed:1} Final Clinical Impression(s) / ED Diagnoses Final diagnoses:  None    Rx / DC Orders ED Discharge Orders     None

## 2023-06-02 NOTE — ED Triage Notes (Addendum)
Seen by PCP several weeks ago for same complaints. Was given antibiotics for findings in stool sample.  Comes in today for right sided abd pain radiating at times into right flank. Worse with sitting. Last BM this AM-no changes to stool. No urinary changes noticed. No HX abd surgeries. Denies N/V/D,  CP/ SOB.

## 2023-06-04 ENCOUNTER — Telehealth (HOSPITAL_BASED_OUTPATIENT_CLINIC_OR_DEPARTMENT_OTHER): Payer: Self-pay | Admitting: Emergency Medicine

## 2023-06-04 MED ORDER — CIPROFLOXACIN HCL 500 MG PO TABS: 500.0000 mg | ORAL_TABLET | Freq: Two times a day (BID) | ORAL | 0 refills | Status: DC

## 2023-06-04 MED ORDER — METRONIDAZOLE 500 MG PO TABS: 500.0000 mg | ORAL_TABLET | Freq: Two times a day (BID) | ORAL | 0 refills | Status: DC

## 2023-06-04 MED ORDER — HYDROCODONE-ACETAMINOPHEN 5-325 MG PO TABS: 1.0000 | ORAL_TABLET | Freq: Four times a day (QID) | ORAL | 0 refills | Status: DC | PRN

## 2023-06-04 NOTE — Telephone Encounter (Signed)
Nursing received a call that the patient had tested positive for Giardia and was still having bad diarrhea and abdominal pain.  He also had a GI pathogen panel that was positive for enteroaggregate of E. coli.  He reports ongoing abdominal pain.  Will treat with a course of pain medication, denies any active nausea.  Confirmed the patient's pharmacy and sent in Cipro/Flagyl and opiate pain medication for short course.

## 2023-06-05 LAB — GASTROINTESTINAL PANEL BY PCR, STOOL (REPLACES STOOL CULTURE)
Adenovirus F40/41: NOT DETECTED
Astrovirus: NOT DETECTED
Campylobacter species: NOT DETECTED
Cryptosporidium: NOT DETECTED
Cyclospora cayetanensis: NOT DETECTED
Entamoeba histolytica: NOT DETECTED
Enteroaggregative E coli (EAEC): DETECTED — AB
Enteropathogenic E coli (EPEC): NOT DETECTED
Enterotoxigenic E coli (ETEC): NOT DETECTED
Giardia lamblia: DETECTED — AB
Norovirus GI/GII: NOT DETECTED
Plesimonas shigelloides: NOT DETECTED
Rotavirus A: NOT DETECTED
Salmonella species: NOT DETECTED
Sapovirus (I, II, IV, and V): NOT DETECTED
Shiga like toxin producing E coli (STEC): NOT DETECTED
Shigella/Enteroinvasive E coli (EIEC): NOT DETECTED
Vibrio cholerae: NOT DETECTED
Vibrio species: NOT DETECTED
Yersinia enterocolitica: NOT DETECTED

## 2023-06-08 ENCOUNTER — Ambulatory Visit (INDEPENDENT_AMBULATORY_CARE_PROVIDER_SITE_OTHER): Payer: Managed Care, Other (non HMO) | Admitting: Physician Assistant

## 2023-06-08 VITALS — BP 124/88 | HR 85 | Temp 97.9°F | Ht 71.5 in | Wt 195.2 lb

## 2023-06-08 DIAGNOSIS — E119 Type 2 diabetes mellitus without complications: Secondary | ICD-10-CM

## 2023-06-08 DIAGNOSIS — R7989 Other specified abnormal findings of blood chemistry: Secondary | ICD-10-CM

## 2023-06-08 DIAGNOSIS — A071 Giardiasis [lambliasis]: Secondary | ICD-10-CM

## 2023-06-08 MED ORDER — FREESTYLE LIBRE 3 SENSOR MISC: 1 refills | Status: DC

## 2023-06-08 NOTE — Progress Notes (Addendum)
 Daniel Davenport is a 38 y.o. male here for a follow up of a pre-existing problem.  History of Present Illness:   Chief Complaint  Patient presents with   Follow-up    Pt here for ED f/u to discuss results from ED 06/02/2023. Pt still c/o right lower quadrant abdominal pain. He is currently on antibiotics.    HPI   05/17/23 -- saw me for abdominal pain/bloating/diarrhea; stool tests confirmed two types of E Coli -- enteroaggregative e coli and shiga-toxin producing e coli, as well as giardia lamblia. He was treated with tinidazole . We also completed CT abdomen/pelvis that was normal.  06/02/23 -- went to ER for RLQ pain. CT abdomen/pelvis was repeated and was normal. Stool studies repeated and showed continued enteroaggregative e coli and giardia lamblia. Also showed persistent elevation in ALT. He was called on 06/04/23 and due to persistent diarrhea and abdominal pain, he was treated with ciprofloxacin  500 mg twice daily x 5 days and flagyl  500 mg twice daily x 7 days, as well as norco for pain.  He is still completing the antibiotics -- will be done on Friday. Took the pain medication x 1 but caused increased nausea Having brownish/blackish stools -- cannot confidently say that they are all truly black. Still having ongoing stabbing pain in RLQ and bloating  He has resumed his metformin  due to recent elevated A1c.  He does not feel like it is causing worsening symptom(s).   Past Medical History:  Diagnosis Date   Abdominal pain    Chest pain    Diabetes mellitus without complication (HCC)    Elevated LFTs    Hyperlipidemia    Lightheadedness    Low back pain    Rib pain    Vitamin B 12 deficiency    Vitamin D  deficiency      Social History   Tobacco Use   Smoking status: Former   Smokeless tobacco: Never  Vaping Use   Vaping status: Never Used  Substance Use Topics   Alcohol use: Yes    Comment: socially   Drug use: Never    No past surgical history on  file.  Family History  Problem Relation Age of Onset   Diabetes Maternal Grandfather    Diabetes Paternal Grandfather    Heart disease Paternal Grandfather    Esophageal cancer Maternal Uncle    Prostate cancer Neg Hx    Colon cancer Neg Hx     No Known Allergies  Current Medications:   Current Outpatient Medications:    atorvastatin (LIPITOR) 10 MG tablet, TAKE 1 TABLET BY MOUTH EVERY DAY, Disp: 90 tablet, Rfl: 1   ciprofloxacin  (CIPRO ) 500 MG tablet, Take 1 tablet (500 mg total) by mouth every 12 (twelve) hours., Disp: 10 tablet, Rfl: 0   Continuous Glucose Sensor (FREESTYLE LIBRE 3 SENSOR) MISC, Place 1 sensor on the skin every 14 days. Use to check glucose continuously, Disp: 2 each, Rfl: 1   HYDROcodone -acetaminophen  (NORCO/VICODIN) 5-325 MG tablet, Take 1-2 tablets by mouth every 6 (six) hours as needed., Disp: 10 tablet, Rfl: 0   metFORMIN  (GLUCOPHAGE ) 500 MG tablet, TAKE 2 TABLETS (1000 MG) BY MOUTH EVERY MORNING AND 1 TABLET (500 MG) EVERY EVENING., Disp: 270 tablet, Rfl: 1   metroNIDAZOLE  (FLAGYL ) 500 MG tablet, Take 1 tablet (500 mg total) by mouth 2 (two) times daily., Disp: 14 tablet, Rfl: 0   Review of Systems:   ROS Negative unless otherwise specified per HPI.  Vitals:   Vitals:  06/08/23 0904  BP: 124/88  Pulse: 85  Temp: 97.9 F (36.6 C)  TempSrc: Temporal  SpO2: 97%  Weight: 195 lb 4 oz (88.6 kg)  Height: 5' 11.5 (1.816 m)     Body mass index is 26.85 kg/m.  Physical Exam:   Physical Exam Vitals and nursing note reviewed.  Constitutional:      Appearance: He is well-developed.  HENT:     Head: Normocephalic.  Eyes:     Conjunctiva/sclera: Conjunctivae normal.     Pupils: Pupils are equal, round, and reactive to light.  Pulmonary:     Effort: Pulmonary effort is normal.  Abdominal:     General: Abdomen is flat.     Palpations: Abdomen is soft.     Tenderness: There is abdominal tenderness in the right lower quadrant. There is no guarding  or rebound.  Musculoskeletal:        General: Normal range of motion.     Cervical back: Normal range of motion.  Skin:    General: Skin is warm and dry.  Neurological:     Mental Status: He is alert and oriented to person, place, and time.  Psychiatric:        Behavior: Behavior normal.        Thought Content: Thought content normal.        Judgment: Judgment normal.     Assessment and Plan:   Elevated LFTs Suspect due to ongoing infection/parasite Will recheck in 1 months time Abstain from alcohol and continue to work on healthy diet  Giardia No red flags Complete additional round of antibiotic(s) Recheck stool test after 2-4 weeks of completion of antibiotic(s)  If persistent symptom(s), will likely need gastroenterology and/or infectious disease for close follow-up of symptom(s) Also reviewed high incidence of lactase-deficiency with giardia - encouraged reducing lactose in diet to see if this helps symptom(s)   Diabetes mellitus without complication [facility]) Patient requesting continuous glucose monitor -- will order however did discuss that this may not be covered and should inquire if Free Style Libre vs Dexcom is preferred Recommend holding metformin  for another week or so due to ongoing gastroenterology symptom(s)  Close follow-up at [facility] ([facility]) preventive care annual visit to recheck Hemoglobin A1c    Lucie Buttner, PA-C

## 2023-06-08 NOTE — Patient Instructions (Addendum)
 It was great to see you!  Based on my research -- stool tests are generally repeated at least 2 weeks after treatment to confirm clearance. Multiple stool samples over consecutive days may be required for accurate results due to intermittent shedding of the parasite.  Please schedule to repeat labs in 2-4 weeks -- this will be a lab-only appointment, you will not see me during this visit. This will be to recheck your liver labs and also take home another stool test.  If symptom(s) persist, we will refer to gastroenterology and/or infectious disease.   I will order a FreeStyle Libre continuous glucose monitor but cannot guarantee coverage -- you may also inquire to see if Dexcom is preferred or better covered with your plan.  Please try to eliminate or reduce lactose from your diet -- this may help.  Take care,  Lucie Buttner PA-C

## 2023-06-27 ENCOUNTER — Encounter: Payer: Self-pay | Admitting: Physician Assistant

## 2023-07-19 ENCOUNTER — Ambulatory Visit: Payer: Managed Care, Other (non HMO) | Admitting: Cardiovascular Disease

## 2023-08-28 NOTE — Progress Notes (Signed)
 Daniel Davenport is a 38 y.o. male here for a {New prob or follow up:31724}.  History of Present Illness:   No chief complaint on file.   HPI  Right Abdominal pain: Pt complains of right *** abdominal pain*** starting ***.     ***   Past Medical History:  Diagnosis Date   Abdominal pain    Chest pain    Diabetes mellitus without complication (HCC)    Elevated LFTs    Hyperlipidemia    Lightheadedness    Low back pain    Rib pain    Vitamin B 12 deficiency    Vitamin D deficiency      Social History   Tobacco Use   Smoking status: Former   Smokeless tobacco: Never  Vaping Use   Vaping status: Never Used  Substance Use Topics   Alcohol use: Yes    Comment: socially   Drug use: Never    No past surgical history on file.  Family History  Problem Relation Age of Onset   Diabetes Maternal Grandfather    Diabetes Paternal Grandfather    Heart disease Paternal Grandfather    Esophageal cancer Maternal Uncle    Prostate cancer Neg Hx    Colon cancer Neg Hx     No Known Allergies  Current Medications:   Current Outpatient Medications:    atorvastatin (LIPITOR) 10 MG tablet, TAKE 1 TABLET BY MOUTH EVERY DAY, Disp: 90 tablet, Rfl: 1   ciprofloxacin (CIPRO) 500 MG tablet, Take 1 tablet (500 mg total) by mouth every 12 (twelve) hours., Disp: 10 tablet, Rfl: 0   Continuous Glucose Sensor (FREESTYLE LIBRE 3 SENSOR) MISC, Place 1 sensor on the skin every 14 days. Use to check glucose continuously, Disp: 2 each, Rfl: 1   HYDROcodone-acetaminophen (NORCO/VICODIN) 5-325 MG tablet, Take 1-2 tablets by mouth every 6 (six) hours as needed., Disp: 10 tablet, Rfl: 0   metFORMIN (GLUCOPHAGE) 500 MG tablet, TAKE 2 TABLETS (1000 MG) BY MOUTH EVERY MORNING AND 1 TABLET (500 MG) EVERY EVENING., Disp: 270 tablet, Rfl: 1   metroNIDAZOLE (FLAGYL) 500 MG tablet, Take 1 tablet (500 mg total) by mouth 2 (two) times daily., Disp: 14 tablet, Rfl: 0   Review of Systems:   Negative unless  otherwise specified per HPI.  Vitals:   There were no vitals filed for this visit.   There is no height or weight on file to calculate BMI.  Physical Exam:   Physical Exam  Assessment and Plan:   There are no diagnoses linked to this encounter.   I, Isabelle Course, acting as a Neurosurgeon for Jarold Motto, [place]., have documented all relevant documentation on the behalf of Jarold Motto, [place], as directed by  Jarold Motto, PA while in the presence of Jarold Motto, [place].  I, Isabelle Course, have reviewed all documentation for this visit. The documentation on 08/28/23 for the exam, diagnosis, procedures, and orders are all accurate and complete.  Jarold Motto, PA-C

## 2023-08-29 ENCOUNTER — Ambulatory Visit

## 2023-08-29 ENCOUNTER — Encounter: Payer: Self-pay | Admitting: Physician Assistant

## 2023-08-29 ENCOUNTER — Ambulatory Visit (INDEPENDENT_AMBULATORY_CARE_PROVIDER_SITE_OTHER): Admitting: Physician Assistant

## 2023-08-29 VITALS — BP 112/80 | HR 83 | Temp 98.4°F | Ht 71.5 in | Wt 195.0 lb

## 2023-08-29 DIAGNOSIS — M25551 Pain in right hip: Secondary | ICD-10-CM

## 2023-08-29 DIAGNOSIS — K219 Gastro-esophageal reflux disease without esophagitis: Secondary | ICD-10-CM

## 2023-08-29 NOTE — Patient Instructions (Signed)
 It was great to see you!  We will get xray of your hip  Start daily OTC (available over the counter without a prescription) antacid such as Pepcid/Prilosec for a few days and then add ibuprofen daily for a few days to see if this helps with your symptom(s)   Next steps would be sending to orthopedist for your hip and/or gastroenterology for your heartburn  Take care,  Jarold Motto PA-C

## 2023-09-10 ENCOUNTER — Encounter: Payer: Self-pay | Admitting: Physician Assistant

## 2023-09-20 ENCOUNTER — Ambulatory Visit
Admission: EM | Admit: 2023-09-20 | Discharge: 2023-09-20 | Disposition: A | Discharge status: Home / Self Care | Attending: Family Medicine | Admitting: Family Medicine

## 2023-09-20 ENCOUNTER — Ambulatory Visit (INDEPENDENT_AMBULATORY_CARE_PROVIDER_SITE_OTHER)

## 2023-09-20 DIAGNOSIS — R079 Chest pain, unspecified: Secondary | ICD-10-CM | POA: Diagnosis not present

## 2023-09-20 DIAGNOSIS — R519 Headache, unspecified: Secondary | ICD-10-CM

## 2023-09-20 DIAGNOSIS — E119 Type 2 diabetes mellitus without complications: Secondary | ICD-10-CM

## 2023-09-20 MED ORDER — EXCEDRIN MIGRAINE 250-250-65 MG PO TABS: 1.0000 | ORAL_TABLET | Freq: Four times a day (QID) | ORAL | 0 refills | Status: DC | PRN

## 2023-09-20 NOTE — ED Provider Notes (Addendum)
 [facility]  Note:  This document was prepared using Conservation officer, historic buildings and may include unintentional dictation errors.  MRN: [id number] DOB: [birth year]  Subjective:   Daniel Davenport is a 38 y.o. male presenting for 4-day history of persistent intermittent frontal to left-sided headache.  He has also had fairly constant moderate left-sided internal and superficial chest pain.  No wheezing, fever, cough, history of respiratory disorders.  No weakness, numbness or tingling, changes in vision, bowel or urinary habits.  No confusion, facial droop.  No difficulty with speech.  He does have diabetes.  This is well-controlled, managed without insulin.  No smoking of any kind including cigarettes, cigars, vaping, marijuana use.    No current facility-administered medications for this encounter.  Current Outpatient Medications:    atorvastatin (LIPITOR) 10 MG tablet, TAKE 1 TABLET BY MOUTH EVERY DAY, Disp: 90 tablet, Rfl: 1   Continuous Glucose Sensor (FREESTYLE LIBRE 3 SENSOR) MISC, Place 1 sensor on the skin every 14 days. Use to check glucose continuously, Disp: 2 each, Rfl: 1   metFORMIN (GLUCOPHAGE) 500 MG tablet, TAKE 2 TABLETS (1000 MG) BY MOUTH EVERY MORNING AND 1 TABLET (500 MG) EVERY EVENING., Disp: 270 tablet, Rfl: 1   No Known Allergies  Past Medical History:  Diagnosis Date   Abdominal pain    Chest pain    Diabetes mellitus without complication (HCC)    Elevated LFTs    Hyperlipidemia    Lightheadedness    Low back pain    Rib pain    Vitamin B 12 deficiency    Vitamin D deficiency      History reviewed. No pertinent surgical history.  Family History  Problem Relation Age of Onset   Diabetes Maternal Grandfather    Diabetes Paternal Grandfather    Heart disease Paternal Grandfather    Esophageal cancer Maternal Uncle    Prostate cancer Neg Hx    Colon cancer Neg Hx     Social History   Tobacco Use   Smoking status: Former    Smokeless tobacco: Never  Advertising account planner   Vaping status: Never Used  Substance Use Topics   Alcohol use: Yes    Comment: socially   Drug use: Never    ROS   Objective:   Vitals: BP 126/78 (BP Location: Right Arm)   Pulse 98   Temp 98.1 F (36.7 C) (Oral)   Resp 18   Ht 5\' 11"  (1.803 m)   Wt 190 lb (86.2 kg)   SpO2 98%   BMI 26.50 kg/m   Physical Exam Constitutional:      General: He is not in acute distress.    Appearance: Normal appearance. He is well-developed and normal weight. He is not ill-appearing, toxic-appearing or diaphoretic.  HENT:     Head: Normocephalic and atraumatic.     Right Ear: Tympanic membrane, ear canal and external ear normal. No drainage, swelling or tenderness. No middle ear effusion. There is no impacted cerumen. Tympanic membrane is not erythematous or bulging.     Left Ear: Tympanic membrane, ear canal and external ear normal. No drainage, swelling or tenderness.  No middle ear effusion. There is no impacted cerumen. Tympanic membrane is not erythematous or bulging.     Nose: Nose normal. No congestion or rhinorrhea.     Mouth/Throat:     Mouth: Mucous membranes are moist.     Pharynx: Oropharynx is clear. No oropharyngeal exudate or posterior oropharyngeal erythema.  Eyes:  General: No scleral icterus.       Right eye: No discharge.        Left eye: No discharge.     Extraocular Movements: Extraocular movements intact.     Conjunctiva/sclera: Conjunctivae normal.  Neck:     Meningeal: Brudzinski's sign and Kernig's sign absent.  Cardiovascular:     Rate and Rhythm: Normal rate and regular rhythm.     Heart sounds: Normal heart sounds. No murmur heard.    No friction rub. No gallop.  Pulmonary:     Effort: Pulmonary effort is normal. No respiratory distress.     Breath sounds: Normal breath sounds. No stridor. No wheezing, rhonchi or rales.  Musculoskeletal:        General: Normal range of motion.     Cervical back: Normal range of  motion and neck supple. No rigidity. No muscular tenderness.  Skin:    General: Skin is warm and dry.  Neurological:     General: No focal deficit present.     Mental Status: He is alert and oriented to person, place, and time.     Cranial Nerves: No cranial nerve deficit, dysarthria or facial asymmetry.     Motor: No weakness, abnormal muscle tone or pronator drift.     Coordination: Romberg sign negative. Coordination normal. Finger-Nose-Finger Test and Heel to [facility] Test normal. Rapid alternating movements normal.     Gait: Gait normal.     Deep Tendon Reflexes: Reflexes normal.  Psychiatric:        Mood and Affect: Mood normal.        Behavior: Behavior normal.        Thought Content: Thought content normal.        Judgment: Judgment normal.    ED ECG REPORT   Date: 09/20/2023  EKG Time: 4:23 PM  Rate: 98bpm  Rhythm: normal sinus rhythm,  normal EKG, normal sinus rhythm  Axis: left sided  Intervals:none  ST&T Change: normal  Narrative Interpretation: Sinus rhythm at 98 bpm, borderline left axis deviation.  Identical to previous EKG from 12/13/2022.    Assessment and Plan :   PDMP not reviewed this encounter.  1. Left-sided chest pain   2. Type 2 diabetes mellitus treated without insulin (HCC)   3. Generalized headaches    X-ray over-read was pending at time of discharge, recommended follow up with only abnormal results. Otherwise will not call for negative over-read. Patient was in agreement.  Recommended conservative management for his left-sided chest pain.  Will finalize treatment plan following blood test results and radiology overread.  Follow-up closely with PCP.     Adolph Hoop, [place] 09/20/23 1627

## 2023-09-20 NOTE — ED Triage Notes (Signed)
 Pt states that he has a headache x3-4 days  Pt states that he has some left sided chest pain x3 weeks

## 2023-11-13 ENCOUNTER — Ambulatory Visit: Payer: Self-pay | Admitting: Physician Assistant

## 2023-11-13 ENCOUNTER — Ambulatory Visit (INDEPENDENT_AMBULATORY_CARE_PROVIDER_SITE_OTHER): Admitting: Physician Assistant

## 2023-11-13 ENCOUNTER — Encounter: Payer: Self-pay | Admitting: Physician Assistant

## 2023-11-13 ENCOUNTER — Ambulatory Visit (HOSPITAL_COMMUNITY)
Admission: RE | Admit: 2023-11-13 | Discharge: 2023-11-13 | Disposition: A | Discharge status: Home / Self Care | Source: Ambulatory Visit | Attending: Physician Assistant | Admitting: Physician Assistant

## 2023-11-13 ENCOUNTER — Other Ambulatory Visit: Payer: Self-pay

## 2023-11-13 VITALS — Temp 98.2°F | Ht 71.0 in | Wt 196.0 lb

## 2023-11-13 DIAGNOSIS — R079 Chest pain, unspecified: Secondary | ICD-10-CM

## 2023-11-13 DIAGNOSIS — E119 Type 2 diabetes mellitus without complications: Secondary | ICD-10-CM

## 2023-11-13 DIAGNOSIS — G4452 New daily persistent headache (NDPH): Secondary | ICD-10-CM

## 2023-11-13 DIAGNOSIS — Z7984 Long term (current) use of oral hypoglycemic drugs: Secondary | ICD-10-CM

## 2023-11-13 DIAGNOSIS — K219 Gastro-esophageal reflux disease without esophagitis: Secondary | ICD-10-CM

## 2023-11-13 MED ORDER — PANTOPRAZOLE SODIUM 40 MG PO TBEC: 40.0000 mg | DELAYED_RELEASE_TABLET | Freq: Every day | ORAL | 1 refills | Status: DC

## 2023-11-13 NOTE — Patient Instructions (Signed)
 It was great to see you!  We will order protonix 40 mg daily for your heartburn We will get stat MRI of your brain We will get blood work and be in touch with your results Next time you come in, please follow up with us  in 2 weeks -- bring your blood pressure monitor and blood pressure log with you  Your blood pressure is elevated in our office today.  I recommend that you monitor this at home.  Your goal blood pressure should be around < 130/80, unless you are over 28 years old, your goal may be closer to 140-150/90. Please note if you have been given other goals from a cardiologist or other healthcare provider, please defer to their recommendations.  When preparing to take your blood pressure: Plan ahead. Don't smoke, drink caffeine or exercise within 30 minutes before taking your blood pressure. Empty your bladder. Don't take the measurement over clothes. Remove the clothing over the arm that will be used to measure blood pressure. You can use either arm unless otherwise told by a healthcare provider. Usually there is not a big difference between readings on them. Be still. Allow at least five minutes of quiet rest before measurements. Don't talk or use the phone. Sit correctly. Sit with your back straight and supported (on a dining chair, rather than a sofa). Your feet should be flat on the floor. Do not cross your legs. Support your arm on a flat surface. The middle of the cuff should be placed on the upper arm at heart level.  Measure at the same time of the day. Take multiple readings and record the results. Each time you measure, take two readings one minute apart. Record the results and bring in to your next office visit.  In order to know how well the medication is working, I would like you to take your readings 1-2 hours after taking your blood pressure medication if possible. Take your blood pressure measurements and record 2-3 days per week.  If you get a high blood pressure  reading: A single high reading is not an immediate cause for alarm. If you get a reading that is higher than normal, take your blood pressure a second time. Write down the results of both measurements. Check with your health care professional to see if there's a health concern or whether there may be problems with your monitor. If your blood pressure readings are suddenly higher than 180/120 mm Hg, wait at least one minute and test again. If your readings are still very high, contact your health care professional immediately. You could be having a hypertensive crisis. Call 911 if your blood pressure is higher than 180/120 mm Hg and if you are having new signs or symptoms that may include: Chest pain Shortness of breath Back pain Numbness Weakness Change in vision Difficulty speaking Confusion Dizziness Vomiting     Take care,  Alexander Iba PA-C

## 2023-11-13 NOTE — Progress Notes (Signed)
 Daniel Davenport is a 38 y.o. male here for a new problem.  History of Present Illness:   Chief Complaint  Patient presents with   Headache    Pt c/o headache above left eye x 1 week, OTC Migraine medicine, Tylenol  with no relief. Had vision checked last month was fine. Also having left side chest pain off and on.    HPI  Chest pain / Headaches  Pt complains of intermittent left-sided chest pain and headaches, both starting one week ago.  Chest pain is not exertional. Denies shortness of breath . He reports that the headaches have occurred in different portions of his head, but usually above his eyes. He states that the headaches occur particularly above his left eye. Denies thunderclap onset of headache(s) or worst headache(s) of life. He also complains of heartburn, which he feels on the left side and radiates up through the center of his chest. Reports lightheadedness, fatigue, slower thinking, reduced sensation on the left side, and bilateral calf pain, worse in left calf. Endorses OTC migraine medicine, Tylenol , and Excedrin  Migraine with no relief. Endorses reducing screen time and eating more bananas, which have helped slightly.  Denies any increase in stress, hx of headaches, vision changes, blurred or double vision, URI, allergies, ear pain, one-sided weakness, memory issues, Fhx of strokes, or stool changes. Denies relation of heartburn sensation to eating. He notes he traveled to [place]  in April and returned in May, in which he felt normal at this time. He also notes he had a normal vision test last month and his BP at home was 90/70. Pt is agreeable to updating blood work including thyroid  check, and head imaging.  He is diabetic and due for Hemoglobin A1c. Currently taking metformin  500 mg twice daily   Past Medical History:  Diagnosis Date   Abdominal pain    Chest pain    Diabetes mellitus without complication (HCC)    Elevated LFTs    Hyperlipidemia     Lightheadedness    Low back pain    Rib pain    Vitamin B 12 deficiency    Vitamin D  deficiency      Social History   Tobacco Use   Smoking status: Former   Smokeless tobacco: Never  Vaping Use   Vaping status: Never Used  Substance Use Topics   Alcohol use: Yes    Comment: socially   Drug use: Never    No past surgical history on file.  Family History  Problem Relation Age of Onset   Diabetes Maternal Grandfather    Diabetes Paternal Grandfather    Heart disease Paternal Grandfather    Esophageal cancer Maternal Uncle    Prostate cancer Neg Hx    Colon cancer Neg Hx     No Known Allergies  Current Medications:   Current Outpatient Medications:    aspirin-acetaminophen -caffeine (EXCEDRIN  MIGRAINE) 250-250-65 MG tablet, Take 1 tablet by mouth every 6 (six) hours as needed for headache., Disp: 30 tablet, Rfl: 0   atorvastatin (LIPITOR) 10 MG tablet, TAKE 1 TABLET BY MOUTH EVERY DAY, Disp: 90 tablet, Rfl: 1   Continuous Glucose Sensor (FREESTYLE LIBRE 3 SENSOR) MISC, Place 1 sensor on the skin every 14 days. Use to check glucose continuously, Disp: 2 each, Rfl: 1   metFORMIN  (GLUCOPHAGE ) 500 MG tablet, TAKE 2 TABLETS (1000 MG) BY MOUTH EVERY MORNING AND 1 TABLET (500 MG) EVERY EVENING. (Patient taking differently: Take 500 mg by mouth 2 (two) times daily with a meal.  TAKE 2 TABLETS (1000 MG) BY MOUTH EVERY MORNING AND 1 TABLET (500 MG) EVERY EVENING.), Disp: 270 tablet, Rfl: 1   pantoprazole (PROTONIX) 40 MG tablet, Take 1 tablet (40 mg total) by mouth daily., Disp: 30 tablet, Rfl: 1   Review of Systems:   Negative unless otherwise specified per HPI.  Vitals:   Vitals:   11/13/23 1408 11/13/23 1451 11/13/23 1452 11/13/23 1453  Temp: 98.2 F (36.8 C)     TempSrc: Temporal     SpO2: 96% 95% 97% 95%  Weight: 196 lb (88.9 kg)     Height: 5\' 11"  (1.803 m)        Body mass index is 27.34 kg/m.  Physical Exam:   Physical Exam Vitals and nursing note reviewed.   Constitutional:      General: He is not in acute distress.    Appearance: He is well-developed. He is not ill-appearing or toxic-appearing.  Cardiovascular:     Rate and Rhythm: Normal rate and regular rhythm.     Pulses: Normal pulses.     Heart sounds: Normal heart sounds, S1 normal and S2 normal.  Pulmonary:     Effort: Pulmonary effort is normal.     Breath sounds: Normal breath sounds.  Musculoskeletal:     Comments: Tenderness to b/l calves No obvious swelling or erythema  Skin:    General: Skin is warm and dry.  Neurological:     General: No focal deficit present.     Mental Status: He is alert.     GCS: GCS eye subscore is 4. GCS verbal subscore is 5. GCS motor subscore is 6.     Cranial Nerves: Cranial nerves 2-12 are intact.     Sensory: Sensory deficit (reduced sensation to left lower jaw) present.     Motor: Motor function is intact.     Coordination: Coordination is intact.     Gait: Gait is intact.  Psychiatric:        Speech: Speech normal.        Behavior: Behavior normal. Behavior is cooperative.     Assessment and Plan:   1. Chest pain, unspecified type (Primary) EKG tracing is personally reviewed.  EKG notes NSR.  No acute changes.  Unclear etiology Will order stat D-dimer -- he did have recent prolonged travel, however vitals are currently stable If positive, will order stat CT angiogram If worsening chest pain or new shortness of breath, he was advised to go to the ER in the interim - EKG 12-Lead - CBC with Differential/Platelet - Comprehensive metabolic panel with GFR - Hemoglobin A1c - TSH - D-Dimer, Quantitative  2. New daily persistent headache Due to reduced sensation to left lower aspect of face and persistence of pain will obtain stat MRI for further evaluation Advised if any worsening symptom(s) in the meantime, to present to the ER  - MR Brain Wo Contrast; Future  3. Gastroesophageal reflux disease, unspecified whether esophagitis  present No red flags Will trial oral protonix 40 mg daily  If no improvement, will refer to gastroenterology   4. Diabetes mellitus without complication (HCC) Update Hemoglobin A1c and provide recommendations to metformin  500 mg twice daily accordingly  I, Timoteo Force, acting as a Neurosurgeon for [employer], [place]., have documented all relevant documentation on the behalf of Alexander Iba, [place], as directed by  Alexander Iba, PA while in the presence of Alexander Iba, [place].  I, Alexander Iba, [place], have reviewed all documentation for this visit. The documentation  on 11/13/23 for the exam, diagnosis, procedures, and orders are all accurate and complete.  Alexander Iba, PA-C

## 2023-11-14 ENCOUNTER — Other Ambulatory Visit: Payer: Self-pay | Admitting: Physician Assistant

## 2023-11-14 DIAGNOSIS — R7989 Other specified abnormal findings of blood chemistry: Secondary | ICD-10-CM

## 2023-11-14 DIAGNOSIS — R079 Chest pain, unspecified: Secondary | ICD-10-CM

## 2023-11-14 LAB — COMPREHENSIVE METABOLIC PANEL WITH GFR
AG Ratio: 1.9 (calc) (ref 1.0–2.5)
ALT: 53 U/L — ABNORMAL HIGH (ref 9–46)
AST: 27 U/L (ref 10–40)
Albumin: 4.6 g/dL (ref 3.6–5.1)
Alkaline phosphatase (APISO): 85 U/L (ref 36–130)
BUN: 9 mg/dL (ref 7–25)
CO2: 25 mmol/L (ref 20–32)
Calcium: 9.7 mg/dL (ref 8.6–10.3)
Chloride: 105 mmol/L (ref 98–110)
Creat: 0.62 mg/dL (ref 0.60–1.26)
Globulin: 2.4 g/dL (ref 1.9–3.7)
Glucose, Bld: 121 mg/dL — ABNORMAL HIGH (ref 65–99)
Potassium: 4.2 mmol/L (ref 3.5–5.3)
Sodium: 140 mmol/L (ref 135–146)
Total Bilirubin: 0.5 mg/dL (ref 0.2–1.2)
Total Protein: 7 g/dL (ref 6.1–8.1)
eGFR: 127 mL/min/{1.73_m2} (ref >=60)

## 2023-11-14 LAB — CBC WITH DIFFERENTIAL/PLATELET
Absolute Lymphocytes: 2168 {cells}/uL (ref 850–3900)
Absolute Monocytes: 438 {cells}/uL (ref 200–950)
Basophils Absolute: 58 {cells}/uL (ref 0–200)
Basophils Relative: 0.8 %
Eosinophils Absolute: 212 {cells}/uL (ref 15–500)
Eosinophils Relative: 2.9 %
HCT: 43.5 % (ref 38.5–50.0)
Hemoglobin: 14.2 g/dL (ref 13.2–17.1)
MCH: 27.3 pg (ref 27.0–33.0)
MCHC: 32.6 g/dL (ref 32.0–36.0)
MCV: 83.7 fL (ref 80.0–100.0)
MPV: 9.8 fL (ref 7.5–12.5)
Monocytes Relative: 6 %
Neutro Abs: 4424 {cells}/uL (ref 1500–7800)
Neutrophils Relative %: 60.6 %
Platelets: 321 10*3/uL (ref 140–400)
RBC: 5.2 10*6/uL (ref 4.20–5.80)
RDW: 12.9 % (ref 11.0–15.0)
Total Lymphocyte: 29.7 %
WBC: 7.3 10*3/uL (ref 3.8–10.8)

## 2023-11-14 LAB — HEMOGLOBIN A1C
Hgb A1c MFr Bld: 6.9 % — ABNORMAL HIGH (ref <5.7)
Mean Plasma Glucose: 151 mg/dL
eAG (mmol/L): 8.4 mmol/L

## 2023-11-14 LAB — D-DIMER, QUANTITATIVE: D-Dimer, Quant: <0.19 ug{FEU}/mL (ref <0.50)

## 2023-11-14 LAB — TSH: TSH: 1.67 m[IU]/L (ref 0.40–4.50)

## 2023-11-28 ENCOUNTER — Ambulatory Visit
Admission: RE | Admit: 2023-11-28 | Discharge: 2023-11-28 | Disposition: A | Discharge status: Home / Self Care | Source: Ambulatory Visit | Attending: Physician Assistant | Admitting: Physician Assistant

## 2023-11-28 DIAGNOSIS — R7989 Other specified abnormal findings of blood chemistry: Secondary | ICD-10-CM

## 2023-11-29 ENCOUNTER — Ambulatory Visit: Payer: Self-pay | Admitting: Physician Assistant

## 2023-12-11 ENCOUNTER — Other Ambulatory Visit: Payer: Self-pay | Admitting: Physician Assistant

## 2023-12-20 ENCOUNTER — Other Ambulatory Visit: Payer: Self-pay | Admitting: Physician Assistant

## 2024-01-10 IMAGING — DX DG LUMBAR SPINE COMPLETE 4+V
4 series · 4 of 4 positions shown · non-contrast
Comparison: None.

CLINICAL DATA: Low back pain.

EXAM:
LUMBAR SPINE - COMPLETE 4+ VIEW

[l-spine ap]
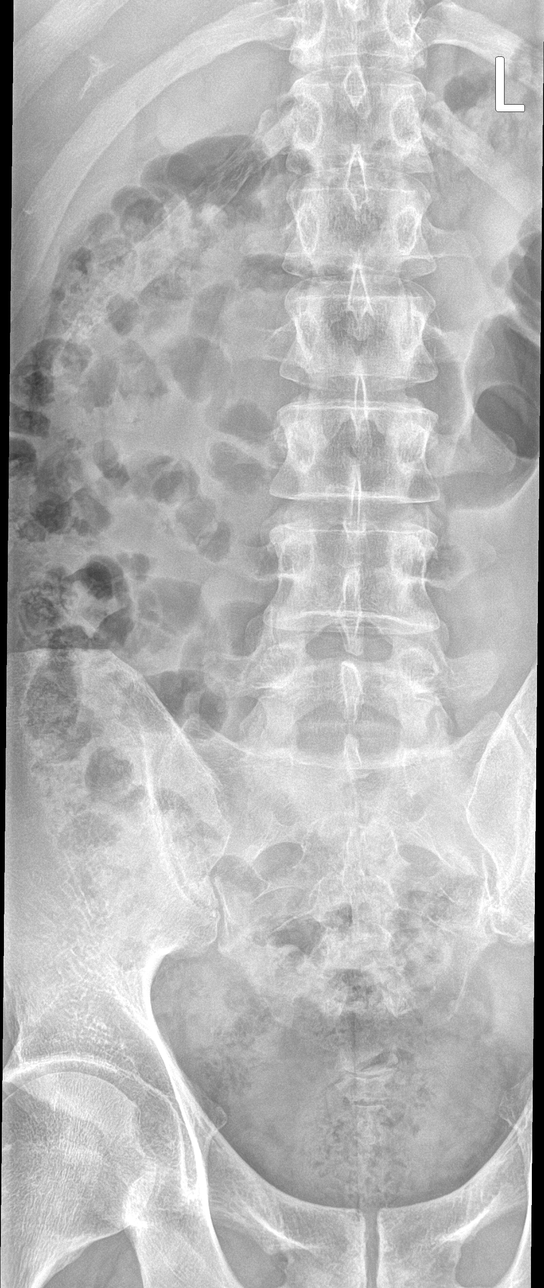

[l-spine lat]
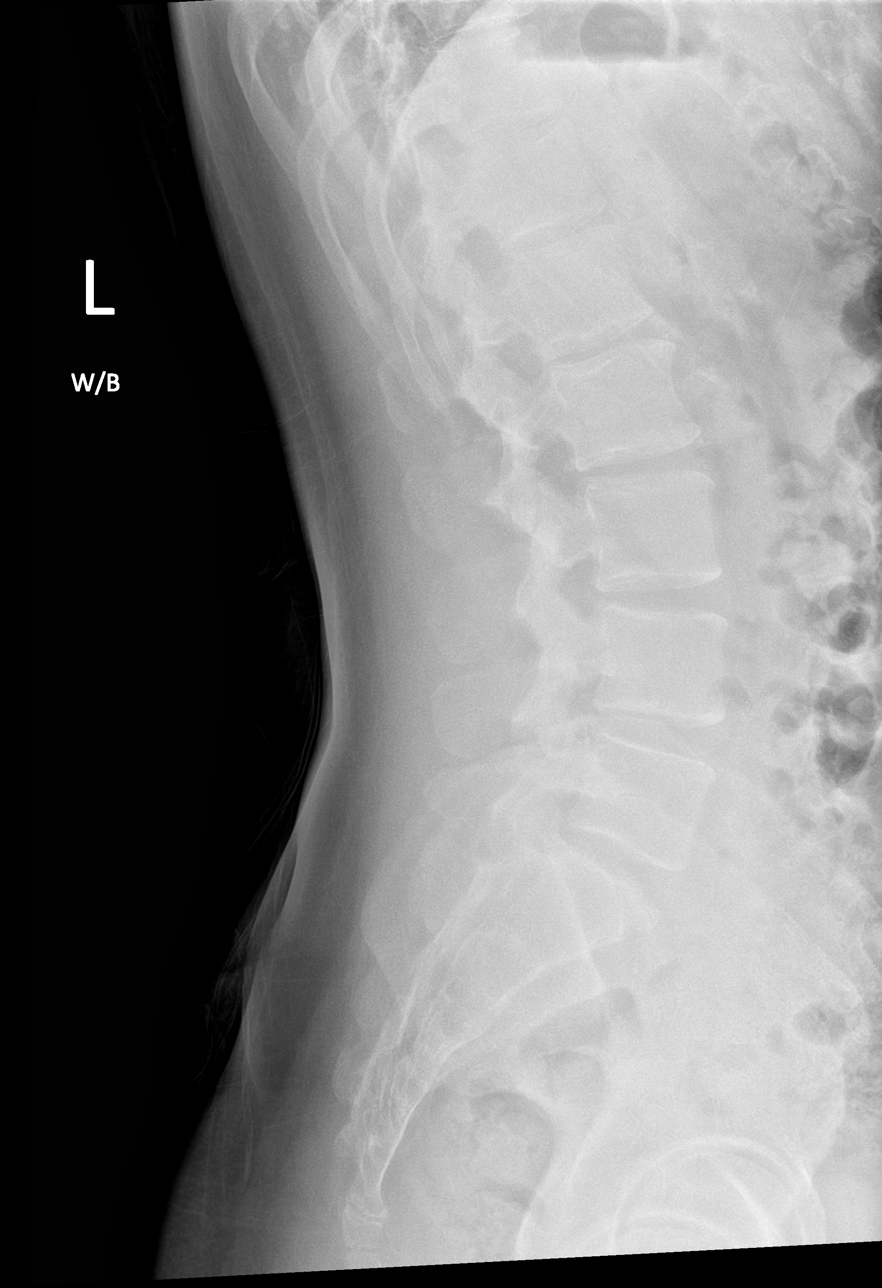

[l-spine flex]
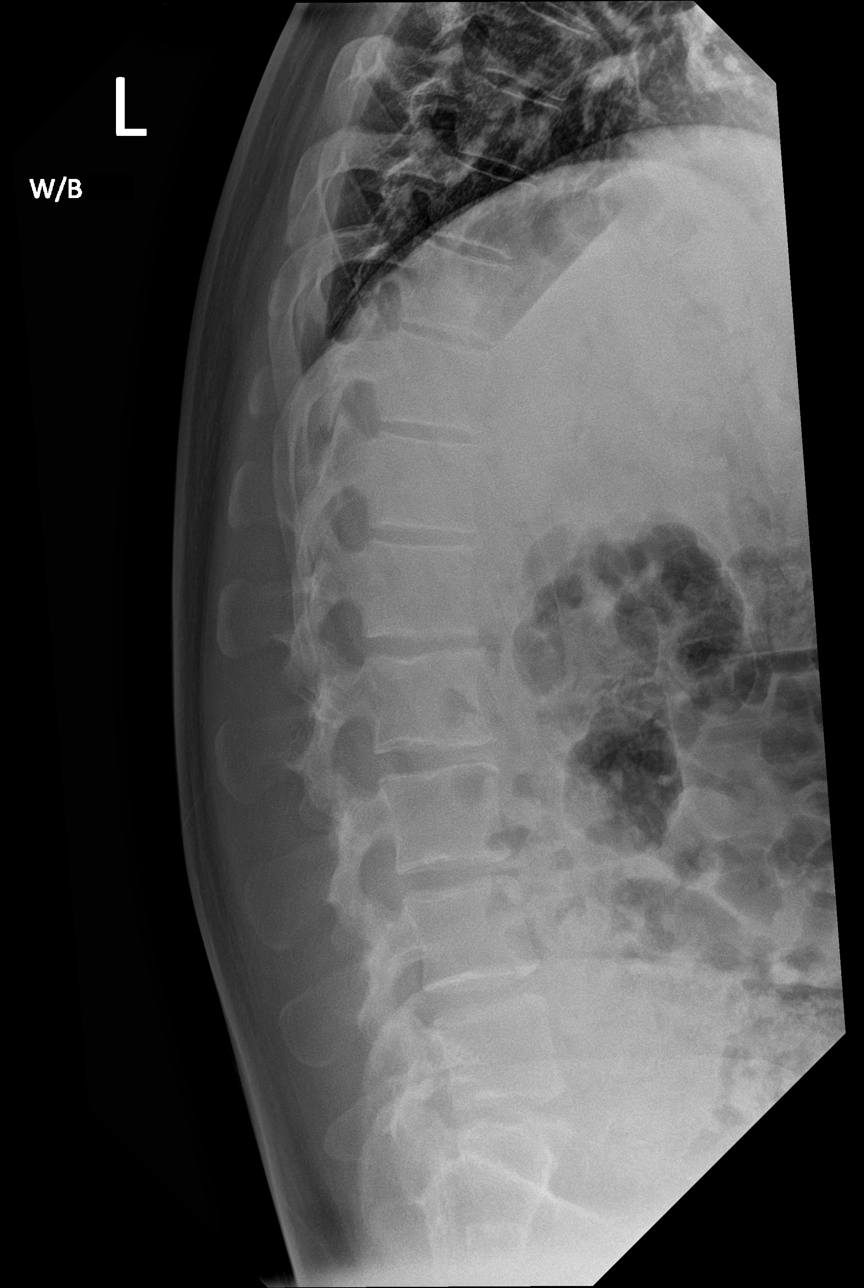

[l-spine ext]
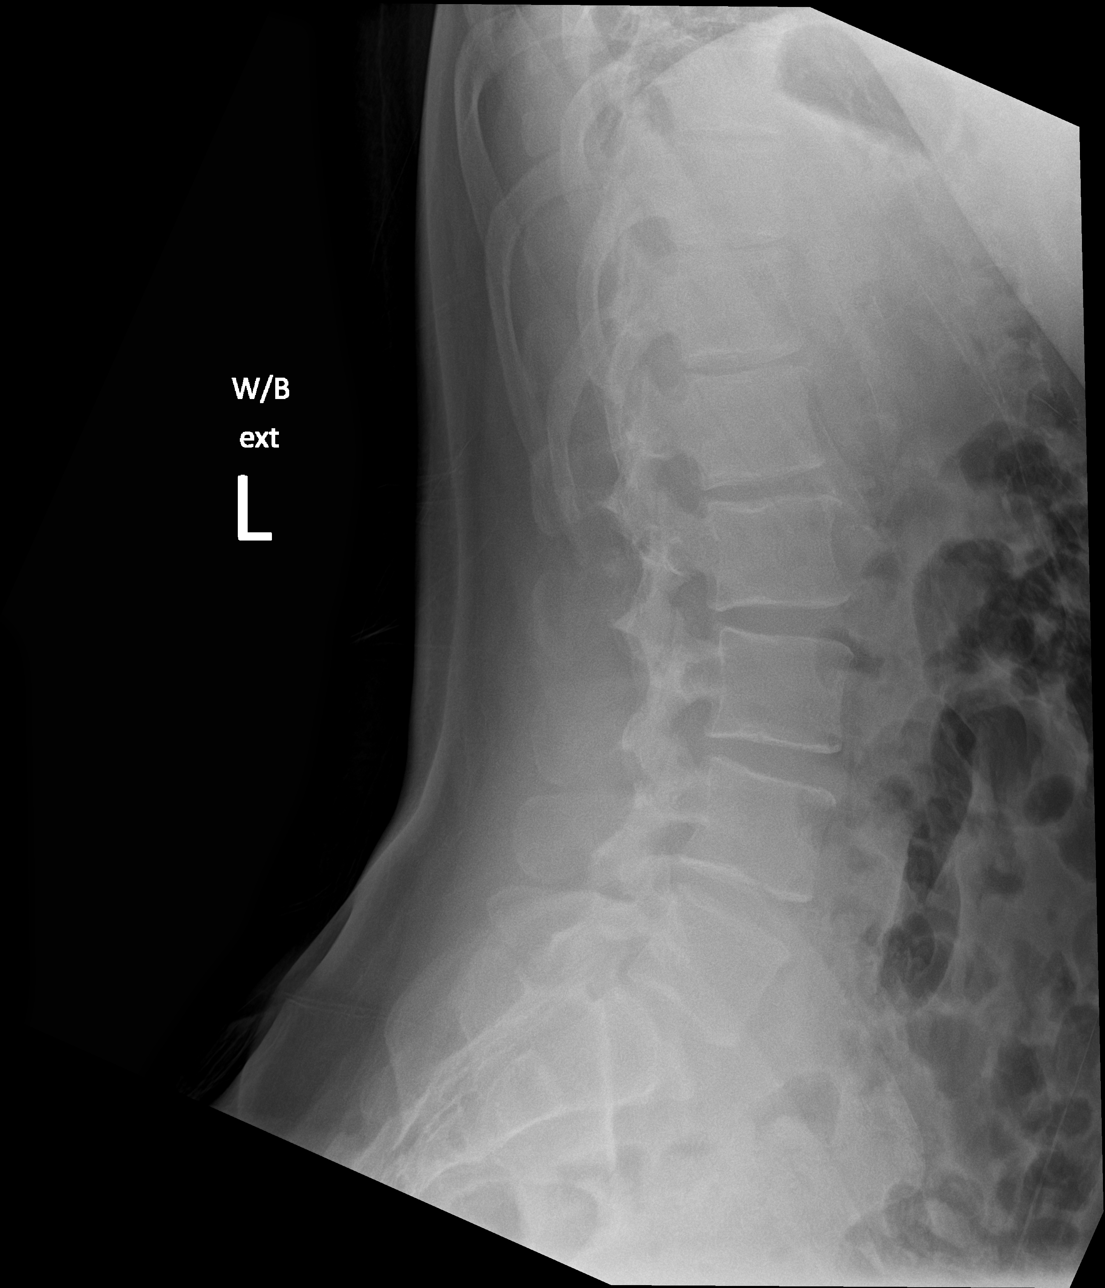

[4 of 4 positions shown; findings below may reference images not displayed]

FINDINGS: No acute fracture or subluxation of the lumbar spine. Mild
degenerative changes with disc space narrowing and spurring most
prominent at L1-L2 and L2-L3. The visualized posterior elements are
intact. The soft tissues are unremarkable.
IMPRESSION: No acute findings.

## 2024-01-19 NOTE — Telephone Encounter (Signed)
 Called pt and someone already called to schedule an appt.

## 2024-01-22 ENCOUNTER — Ambulatory Visit: Admitting: Cardiology

## 2024-01-22 DIAGNOSIS — R079 Chest pain, unspecified: Secondary | ICD-10-CM | POA: Insufficient documentation

## 2024-01-22 DIAGNOSIS — E559 Vitamin D deficiency, unspecified: Secondary | ICD-10-CM | POA: Insufficient documentation

## 2024-01-22 DIAGNOSIS — R109 Unspecified abdominal pain: Secondary | ICD-10-CM | POA: Insufficient documentation

## 2024-01-22 DIAGNOSIS — R42 Dizziness and giddiness: Secondary | ICD-10-CM | POA: Insufficient documentation

## 2024-01-22 DIAGNOSIS — E538 Deficiency of other specified B group vitamins: Secondary | ICD-10-CM | POA: Insufficient documentation

## 2024-01-22 DIAGNOSIS — R0781 Pleurodynia: Secondary | ICD-10-CM | POA: Insufficient documentation

## 2024-01-22 DIAGNOSIS — M545 Low back pain, unspecified: Secondary | ICD-10-CM | POA: Insufficient documentation

## 2024-01-22 DIAGNOSIS — R7989 Other specified abnormal findings of blood chemistry: Secondary | ICD-10-CM | POA: Insufficient documentation

## 2024-01-23 ENCOUNTER — Ambulatory Visit: Attending: Cardiology | Admitting: Cardiology

## 2024-01-23 ENCOUNTER — Encounter: Payer: Self-pay | Admitting: Cardiology

## 2024-01-23 VITALS — BP 118/88 | HR 87 | Ht 71.0 in | Wt 192.2 lb

## 2024-01-23 DIAGNOSIS — E785 Hyperlipidemia, unspecified: Secondary | ICD-10-CM

## 2024-01-23 DIAGNOSIS — E119 Type 2 diabetes mellitus without complications: Secondary | ICD-10-CM

## 2024-01-23 DIAGNOSIS — R011 Cardiac murmur, unspecified: Secondary | ICD-10-CM | POA: Diagnosis not present

## 2024-01-23 DIAGNOSIS — R079 Chest pain, unspecified: Secondary | ICD-10-CM | POA: Diagnosis not present

## 2024-01-23 NOTE — Progress Notes (Signed)
 Cardiology Office Note:    Date:  01/23/2024   ID:  Daniel Davenport, DOB May 29, 1986, MRN [id number]  PCP:  Job Lukes, PA  Cardiologist:  Jennifer JONELLE Crape, MD   Referring MD: Job Lukes, PA    ASSESSMENT:    1. Hyperlipidemia, unspecified hyperlipidemia type   2. Chest pain, unspecified type   3. Diabetes mellitus without complication (HCC)   4. Cardiac murmur    PLAN:    In order of problems listed above:  Primary prevention stressed with the patient.  Importance of compliance with diet medication stressed and patient verbalized standing. Chest pain: Atypical in nature but to reassure him we will do a stress echo. Cardiac murmur: Echocardiogram will be done to assess murmur heard on auscultation. Essential hypertension: Blood pressure is stable and diet was emphasized. Mixed dyslipidemia: On lipid-lowering medications followed by primary care.  Goal LDL less than 60. For calcium scoring I will set him up for the test.  This will help with coronary risk stratification. Patient will be seen in follow-up appointment in 6 months or earlier if the patient has any concerns.    Medication Adjustments/Labs and Tests Ordered: Current medicines are reviewed at length with the patient today.  Concerns regarding medicines are outlined above.  Orders Placed This Encounter  Procedures   EKG 12-Lead   No orders of the defined types were placed in this encounter.    History of Present Illness:    Daniel Davenport is a 38 y.o. male who is being seen today for the evaluation of chest pain at the request of Job Lukes, [place].  Patient is a pleasant 38 year old male.  He has a past medical history of essential hypertension, dyslipidemia, diabetes mellitus and elevated LFTs.  He mentions to me that he has chest pain at times not related to exertion.  It is more of a tingling like sensation and he is concerned about it.  No orthopnea or PND.  Exercise does not reproduce it.  No  palpitations syncope or any such features.  At the time of my evaluation, the patient is alert awake oriented and in no distress.  Past Medical History:  Diagnosis Date   Abdominal pain    Chest pain    Diabetes mellitus without complication (HCC)    Elevated LFTs    Hyperlipidemia    Lightheadedness    Low back pain    Rib pain    Vitamin B 12 deficiency    Vitamin D  deficiency     History reviewed. No pertinent surgical history.  Current Medications: Current Meds  Medication Sig   aspirin-acetaminophen -caffeine (EXCEDRIN  MIGRAINE) 250-250-65 MG tablet Take 1 tablet by mouth every 6 (six) hours as needed for headache.   atorvastatin (LIPITOR) 10 MG tablet TAKE 1 TABLET BY MOUTH EVERY DAY   Continuous Glucose Sensor (FREESTYLE LIBRE 3 SENSOR) MISC Place 1 sensor on the skin every 14 days. Use to check glucose continuously   metFORMIN  (GLUCOPHAGE ) 500 MG tablet TAKE 2 TABLETS (1000 MG) BY MOUTH EVERY MORNING AND 1 TABLET (500 MG) EVERY EVENING.   pantoprazole  (PROTONIX ) 40 MG tablet TAKE 1 TABLET BY MOUTH EVERY DAY     Allergies:   Patient has no known allergies.   Social History   Socioeconomic History   Marital status: Married    Spouse name: Not on file   Number of children: Not on file   Years of education: Not on file   Highest education level: Master's degree (e.g., MA,  MS, MEng, MEd, MSW, MBA)  Occupational History   Not on file  Tobacco Use   Smoking status: Former   Smokeless tobacco: Never  Vaping Use   Vaping status: Never Used  Substance and Sexual Activity   Alcohol use: Yes    Comment: socially   Drug use: Never   Sexual activity: Yes  Other Topics Concern   Not on file  Social History Narrative   Working for [employer]   Social Drivers of Health   Financial Resource Strain: Low Risk  (06/08/2023)   Overall Financial Resource Strain (CARDIA)    Difficulty of Paying Living Expenses: Not very hard  Food Insecurity: No Food Insecurity (06/08/2023)   Hunger  Vital Sign    Worried About Running Out of Food in the Last Year: Never true    Ran Out of Food in the Last Year: Never true  Transportation Needs: No Transportation Needs (06/08/2023)   PRAPARE - Administrator, Civil Service (Medical): No    Lack of Transportation (Non-Medical): No  Physical Activity: Insufficiently Active (06/08/2023)   Exercise Vital Sign    Days of Exercise per Week: 3 days    Minutes of Exercise per Session: 20 min  Stress: Stress Concern Present (06/08/2023)   [employer] of Occupational Health - Occupational Stress Questionnaire    Feeling of Stress : To some extent  Social Connections: Moderately Isolated (06/08/2023)   Social Connection and Isolation Panel    Frequency of Communication with Friends and Family: Twice a week    Frequency of Social Gatherings with Friends and Family: Once a week    Attends Religious Services: Never    Database administrator or Organizations: No    Attends Engineer, structural: Not on file    Marital Status: Married     Family History: The patient's family history includes Diabetes in his maternal grandfather and paternal grandfather; Esophageal cancer in his maternal uncle; Heart disease in his paternal grandfather. There is no history of Prostate cancer or Colon cancer.  ROS:   Please see the history of present illness.    All other systems reviewed and are negative.  EKGs/Labs/Other Studies Reviewed:    The following studies were reviewed today:  EKG Interpretation Date/Time:  Tuesday January 23 2024 09:20:43 EDT Ventricular Rate:  87 PR Interval:  148 QRS Duration:  84 QT Interval:  346 QTC Calculation: 416 R Axis:   55  Text Interpretation: Normal sinus rhythm with sinus arrhythmia Normal ECG When compared with ECG of 20-Sep-2023 15:24, QRS axis Shifted right Confirmed by Edwyna Backers [phone]) on 01/23/2024 9:38:02 AM     Recent Labs: 11/13/2023: ALT 53; BUN 9; Creat 0.62; Hemoglobin 14.2;  Platelets 321; Potassium 4.2; Sodium 140; TSH 1.67  Recent Lipid Panel    Component Value Date/Time   CHOL 164 12/13/2022 1346   TRIG 134.0 12/13/2022 1346   HDL 42.40 12/13/2022 1346   CHOLHDL 4 12/13/2022 1346   VLDL 26.8 12/13/2022 1346   LDLCALC 95 12/13/2022 1346    Physical Exam:    VS:  BP 118/88   Pulse 87   Ht 5' 11 (1.803 m)   Wt 192 lb 3.2 oz (87.2 kg)   SpO2 96%   BMI 26.81 kg/m     Wt Readings from Last 3 Encounters:  01/23/24 192 lb 3.2 oz (87.2 kg)  11/13/23 196 lb (88.9 kg)  09/20/23 190 lb (86.2 kg)     GEN: Patient  is in no acute distress HEENT: Normal NECK: No JVD; No carotid bruits LYMPHATICS: No lymphadenopathy CARDIAC: S1 S2 regular, 2/6 systolic murmur at the apex. RESPIRATORY:  Clear to auscultation without rales, wheezing or rhonchi  ABDOMEN: Soft, non-tender, non-distended MUSCULOSKELETAL:  No edema; No deformity  SKIN: Warm and dry NEUROLOGIC:  Alert and oriented x 3 PSYCHIATRIC:  Normal affect    Signed, Jennifer JONELLE Crape, MD  01/23/2024 9:49 AM    [facility]

## 2024-01-23 NOTE — Patient Instructions (Signed)
 Medication Instructions:  Your physician recommends that you continue on your current medications as directed. Please refer to the Current Medication list given to you today.  *If you need a refill on your cardiac medications before your next appointment, please call your pharmacy*  Lab Work: None If you have labs (blood work) drawn today and your tests are completely normal, you will receive your results only by: MyChart Message (if you have MyChart) OR A paper copy in the mail If you have any lab test that is abnormal or we need to change your treatment, we will call you to review the results.  Testing/Procedures: Your physician has requested that you have an echocardiogram. Echocardiography is a painless test that uses sound waves to create images of your heart. It provides your doctor with information about the size and shape of your heart and how well your heart's chambers and valves are working. This procedure takes approximately one hour. There are no restrictions for this procedure. Please do NOT wear cologne, perfume, aftershave, or lotions (deodorant is allowed). Please arrive 15 minutes prior to your appointment time.  Please note: We ask at that you not bring children with you during ultrasound (echo/ vascular) testing. Due to room size and safety concerns, children are not allowed in the ultrasound rooms during exams. Our front office staff cannot provide observation of children in our lobby area while testing is being conducted. An adult accompanying a patient to their appointment will only be allowed in the ultrasound room at the discretion of the ultrasound technician under special circumstances. We apologize for any inconvenience.  Your physician has requested that you have a stress echocardiogram. For further information please visit [email or web address] Please follow instruction sheet as given.  Please note: We ask at that you not bring children with you during ultrasound  (echo/ vascular) testing. Due to room size and safety concerns, children are not allowed in the ultrasound rooms during exams. Our front office staff cannot provide observation of children in our lobby area while testing is being conducted. An adult accompanying a patient to their appointment will only be allowed in the ultrasound room at the discretion of the ultrasound technician under special circumstances. We apologize for any inconvenience.   We will order CT coronary calcium score. It will cost $99.00 and is due at time of scan.  Please call to schedule.    [facility] at [facility] [address] [place], [place] [address] [phone]  [facility] [address] [place], [place] [address] [phone]  Follow-Up: At [facility], you and your health needs are our priority.  As part of our continuing mission to provide you with exceptional heart care, our providers are all part of one team.  This team includes your primary Cardiologist (physician) and Advanced Practice Providers or APPs (Physician Assistants and Nurse Practitioners) who all work together to provide you with the care you need, when you need it.  Your next appointment:   9 month(s)  Provider:   Jennifer Crape, MD    We recommend signing up for the patient portal called MyChart.  Sign up information is provided on this After Visit Summary.  MyChart is used to connect with patients for Virtual Visits (Telemedicine).  Patients are able to view lab/test results, encounter notes, upcoming appointments, etc.  Non-urgent messages can be sent to your provider as well.   To learn more about what you can do with MyChart, go  to [email or web address].   Other Instructions None

## 2024-01-26 ENCOUNTER — Telehealth (HOSPITAL_COMMUNITY): Payer: Self-pay | Admitting: *Deleted

## 2024-01-26 ENCOUNTER — Encounter (HOSPITAL_COMMUNITY): Payer: Self-pay | Admitting: *Deleted

## 2024-01-26 NOTE — Telephone Encounter (Signed)
 Stress Echo instructions sent via [employer].

## 2024-02-08 ENCOUNTER — Ambulatory Visit (HOSPITAL_COMMUNITY)
Admission: RE | Admit: 2024-02-08 | Discharge: 2024-02-08 | Disposition: A | Discharge status: Home / Self Care | Source: Ambulatory Visit | Attending: Cardiology | Admitting: Cardiology

## 2024-02-08 DIAGNOSIS — R079 Chest pain, unspecified: Secondary | ICD-10-CM | POA: Insufficient documentation

## 2024-02-08 DIAGNOSIS — R011 Cardiac murmur, unspecified: Secondary | ICD-10-CM | POA: Diagnosis not present

## 2024-02-08 DIAGNOSIS — E119 Type 2 diabetes mellitus without complications: Secondary | ICD-10-CM | POA: Insufficient documentation

## 2024-02-08 DIAGNOSIS — E785 Hyperlipidemia, unspecified: Secondary | ICD-10-CM | POA: Insufficient documentation

## 2024-02-08 MED ORDER — PERFLUTREN LIPID MICROSPHERE
1.0000 mL | INTRAVENOUS | Status: AC | PRN
  Administered 2024-02-08 (×3): 2 mL via INTRAVENOUS

## 2024-02-09 LAB — ECHOCARDIOGRAM STRESS TEST
Area-P 1/2: 3.68 cm2
S' Lateral: 3.01 cm

## 2024-02-10 ENCOUNTER — Ambulatory Visit: Payer: Self-pay | Admitting: Cardiology

## 2024-02-12 ENCOUNTER — Other Ambulatory Visit (HOSPITAL_BASED_OUTPATIENT_CLINIC_OR_DEPARTMENT_OTHER)

## 2024-02-22 ENCOUNTER — Ambulatory Visit (HOSPITAL_COMMUNITY)
Admission: RE | Admit: 2024-02-22 | Discharge: 2024-02-22 | Disposition: A | Discharge status: Home / Self Care | Source: Ambulatory Visit | Attending: Internal Medicine | Admitting: Internal Medicine

## 2024-02-22 DIAGNOSIS — R079 Chest pain, unspecified: Secondary | ICD-10-CM | POA: Diagnosis present

## 2024-02-22 DIAGNOSIS — R011 Cardiac murmur, unspecified: Secondary | ICD-10-CM | POA: Diagnosis present

## 2024-02-22 DIAGNOSIS — E119 Type 2 diabetes mellitus without complications: Secondary | ICD-10-CM | POA: Insufficient documentation

## 2024-02-22 DIAGNOSIS — E785 Hyperlipidemia, unspecified: Secondary | ICD-10-CM | POA: Diagnosis present

## 2024-02-22 LAB — ECHOCARDIOGRAM COMPLETE
Area-P 1/2: 5.42 cm2
S' Lateral: 2.8 cm

## 2024-02-23 ENCOUNTER — Other Ambulatory Visit (HOSPITAL_BASED_OUTPATIENT_CLINIC_OR_DEPARTMENT_OTHER)

## 2024-02-28 ENCOUNTER — Other Ambulatory Visit (HOSPITAL_BASED_OUTPATIENT_CLINIC_OR_DEPARTMENT_OTHER)

## 2024-03-05 ENCOUNTER — Other Ambulatory Visit: Payer: Self-pay | Admitting: *Deleted

## 2024-03-05 MED ORDER — FREESTYLE LIBRE 3 PLUS SENSOR MISC: 2 refills | Status: DC

## 2024-03-25 ENCOUNTER — Ambulatory Visit (INDEPENDENT_AMBULATORY_CARE_PROVIDER_SITE_OTHER): Admitting: Physician Assistant

## 2024-03-25 ENCOUNTER — Ambulatory Visit: Payer: Self-pay | Admitting: Physician Assistant

## 2024-03-25 ENCOUNTER — Ambulatory Visit (INDEPENDENT_AMBULATORY_CARE_PROVIDER_SITE_OTHER)

## 2024-03-25 ENCOUNTER — Ambulatory Visit: Payer: Self-pay

## 2024-03-25 VITALS — BP 110/74 | HR 76 | Temp 98.8°F | Ht 71.0 in | Wt 190.5 lb

## 2024-03-25 DIAGNOSIS — R109 Unspecified abdominal pain: Secondary | ICD-10-CM

## 2024-03-25 DIAGNOSIS — R1031 Right lower quadrant pain: Secondary | ICD-10-CM

## 2024-03-25 DIAGNOSIS — R82998 Other abnormal findings in urine: Secondary | ICD-10-CM | POA: Diagnosis not present

## 2024-03-25 LAB — COMPREHENSIVE METABOLIC PANEL WITH GFR
ALT: 32 U/L (ref 0–53)
AST: 26 U/L (ref 0–37)
Albumin: 4.4 g/dL (ref 3.5–5.2)
Alkaline Phosphatase: 67 U/L (ref 39–117)
BUN: 10 mg/dL (ref 6–23)
CO2: 28 meq/L (ref 19–32)
Calcium: 9.2 mg/dL (ref 8.4–10.5)
Chloride: 105 meq/L (ref 96–112)
Creatinine, Ser: 0.64 mg/dL (ref 0.40–1.50)
GFR: 121.58 mL/min (ref >60.00)
Glucose, Bld: 111 mg/dL — ABNORMAL HIGH (ref 70–99)
Potassium: 3.8 meq/L (ref 3.5–5.1)
Sodium: 141 meq/L (ref 135–145)
Total Bilirubin: 0.5 mg/dL (ref 0.2–1.2)
Total Protein: 6.8 g/dL (ref 6.0–8.3)

## 2024-03-25 LAB — URINALYSIS, ROUTINE W REFLEX MICROSCOPIC
Bilirubin Urine: NEGATIVE
Hgb urine dipstick: NEGATIVE
Ketones, ur: NEGATIVE
Leukocytes,Ua: NEGATIVE
Nitrite: NEGATIVE
Specific Gravity, Urine: >=1.03 — AB (ref 1.000–1.030)
Total Protein, Urine: 100 — AB
Urine Glucose: NEGATIVE
Urobilinogen, UA: 0.2 (ref 0.0–1.0)
pH: 8 (ref 5.0–8.0)

## 2024-03-25 LAB — CBC WITH DIFFERENTIAL/PLATELET
Basophils Absolute: 0.1 K/uL (ref 0.0–0.1)
Basophils Relative: 1.2 % (ref 0.0–3.0)
Eosinophils Absolute: 0.2 K/uL (ref 0.0–0.7)
Eosinophils Relative: 3.5 % (ref 0.0–5.0)
HCT: 39 % (ref 39.0–52.0)
Hemoglobin: 13.2 g/dL (ref 13.0–17.0)
Lymphocytes Relative: 35.3 % (ref 12.0–46.0)
Lymphs Abs: 2 K/uL (ref 0.7–4.0)
MCHC: 33.9 g/dL (ref 30.0–36.0)
MCV: 80.1 fl (ref 78.0–100.0)
Monocytes Absolute: 0.4 K/uL (ref 0.1–1.0)
Monocytes Relative: 6.5 % (ref 3.0–12.0)
Neutro Abs: 3 K/uL (ref 1.4–7.7)
Neutrophils Relative %: 53.5 % (ref 43.0–77.0)
Platelets: 279 K/uL (ref 150.0–400.0)
RBC: 4.87 Mil/uL (ref 4.22–5.81)
RDW: 13.4 % (ref 11.5–15.5)
WBC: 5.6 K/uL (ref 4.0–10.5)

## 2024-03-25 NOTE — Progress Notes (Signed)
 Daniel Davenport is a 38 y.o. male here for a follow up of a pre-existing problem.  History of Present Illness:   Chief Complaint  Patient presents with   Abdominal Pain    Pt c/o right lower quadrant pain x 4-5 days, denies nausea, thinks running fever but has not taken temp.     Discussed the use of AI scribe software for clinical note transcription with the patient, who gave verbal consent to proceed.  History of Present Illness   Daniel Davenport is a 38 year old male who presents with continuous abdominal pain and subjective feeling of feverish for the past four to five days.  He experiences continuous abdominal pain radiating to the lower back, affecting movement and making it difficult to bend. He uses Tylenol  and ibuprofen for fever management. Bowel movements have slowed in the last two to three days without constipation or diarrhea. He reports no nausea and maintains a normal diet. Urine has been foamy with a slight odor for six to seven days, without dysuria or frequency changes. He recalls similar symptoms last December, with normal CT scans, but no back pain at that time. He continues metformin  without recent alcohol increase.        Past Medical History:  Diagnosis Date   Abdominal pain    Chest pain    Diabetes mellitus without complication (HCC)    Elevated LFTs    Hyperlipidemia    Lightheadedness    Low back pain    Rib pain    Vitamin B 12 deficiency    Vitamin D  deficiency      Social History   Tobacco Use   Smoking status: Former   Smokeless tobacco: Never  Vaping Use   Vaping status: Never Used  Substance Use Topics   Alcohol use: Yes    Comment: socially   Drug use: Never    No past surgical history on file.  Family History  Problem Relation Age of Onset   Diabetes Maternal Grandfather    Diabetes Paternal Grandfather    Heart disease Paternal Grandfather    Esophageal cancer Maternal Uncle    Prostate cancer Neg Hx    Colon cancer Neg Hx      No Known Allergies  Current Medications:   Current Outpatient Medications:    atorvastatin (LIPITOR) 10 MG tablet, TAKE 1 TABLET BY MOUTH EVERY DAY, Disp: 90 tablet, Rfl: 1   Continuous Glucose Sensor (FREESTYLE LIBRE 3 PLUS SENSOR) MISC, Change sensor every 15 days., Disp: 2 each, Rfl: 2   metFORMIN  (GLUCOPHAGE ) 500 MG tablet, TAKE 2 TABLETS (1000 MG) BY MOUTH EVERY MORNING AND 1 TABLET (500 MG) EVERY EVENING., Disp: 270 tablet, Rfl: 1   Review of Systems:   Negative unless otherwise specified per HPI.  Vitals:   Vitals:   03/25/24 1049  BP: 110/74  Pulse: 76  Temp: 98.8 F (37.1 C)  TempSrc: Temporal  SpO2: 97%  Weight: 190 lb 8 oz (86.4 kg)  Height: 5' 11 (1.803 m)     Body mass index is 26.57 kg/m.  Physical Exam:   Physical Exam Vitals and nursing note reviewed.  Constitutional:      Appearance: He is well-developed.  HENT:     Head: Normocephalic.  Eyes:     Conjunctiva/sclera: Conjunctivae normal.     Pupils: Pupils are equal, round, and reactive to light.  Pulmonary:     Effort: Pulmonary effort is normal.  Abdominal:     General: Abdomen is  flat. Bowel sounds are normal.     Palpations: Abdomen is soft.     Tenderness: There is abdominal tenderness in the right upper quadrant and right lower quadrant. There is no right CVA tenderness, left CVA tenderness, guarding or rebound.  Musculoskeletal:        General: Normal range of motion.     Cervical back: Normal range of motion.  Skin:    General: Skin is warm and dry.  Neurological:     Mental Status: He is alert and oriented to person, place, and time.  Psychiatric:        Behavior: Behavior normal.        Thought Content: Thought content normal.        Judgment: Judgment normal.     Assessment and Plan:   Assessment and Plan    Right sided abdominal pain Chronic constipation with recent exacerbation, continuous abdominal pain, and fever. Abdominal x-ray shows moderate stool burden.  -  Encouraged MiraLAX daily, increase to twice daily if no improvement. - Continue Colace. - Order CBC to check for infection. - Order urinalysis. - Recheck liver function tests. - Consider CT scan if infection count is high or symptoms persist.  Asked patient to reach out if new/worsening symptom(s) so we can advise further Consider gastroenterology referral if symptom(s) persists     Foamy urine Unclear etiology Will check urinalysis and urine culture Consider urology appointment if symptom(s) persists   Lucie Buttner, PA-C

## 2024-03-25 NOTE — Telephone Encounter (Signed)
 FYI Only or Action Required?: FYI only for provider.  Patient was last seen in primary care on 11/13/2023 by Job Lukes, PA.  Called Nurse Triage reporting Abdominal Cramping.LGF  Symptoms began several days ago.  Interventions attempted: Other: herbal teas.  Symptoms are: gradually worsening.  Triage Disposition: See HCP Within 4 Hours (Or PCP Triage)  Patient/caregiver understands and will follow disposition?: Yes                Copied from CRM #[id number]. Topic: Clinical - Red Word Triage >> Mar 25, 2024  8:03 AM Mesmerise C wrote: [facility] that prompted transfer to Nurse Triage: Patient states he's having stomach pain, difficulty doing things describes it as a cramping states there's swelling to it as well, been ongoing for 4-5 days now, also a low grade fever as well Reason for Disposition  [1] MILD-MODERATE pain AND [2] constant AND [3] present > 2 hours  Answer Assessment - Initial Assessment Questions 1. LOCATION: Where does it hurt?      Right side - lower 2. RADIATION: Does the pain shoot anywhere else? (e.g., chest, back)     On to back 3. ONSET: When did the pain begin? (Minutes, hours or days ago)      4-5 days 4. SUDDEN: Gradual or sudden onset?     gradual 5. PATTERN Does the pain come and go, or is it constant?     constant 6. SEVERITY: How bad is the pain?  (e.g., Scale 1-10; mild, moderate, or severe)     6-7/10 7. RECURRENT SYMPTOM: Have you ever had this type of stomach pain before? If Yes, ask: When was the last time? and What happened that time?      1 year ago 8. CAUSE: What do you think is causing the stomach pain? (e.g., gallstones, recent abdominal surgery)     antibiotics 9. RELIEVING/AGGRAVATING FACTORS: What makes it better or worse? (e.g., antacids, bending or twisting motion, bowel movement)     no 10. OTHER SYMPTOMS: Do you have any other symptoms? (e.g., back pain, diarrhea, fever, urination pain,  vomiting)       Abdomen feels hard, low grade fever taking Tylenol . Slightly bloated  Protocols used: Abdominal Pain - Male-A-AH

## 2024-03-25 NOTE — Telephone Encounter (Signed)
 Noted, appt today.

## 2024-03-25 NOTE — Patient Instructions (Signed)
To treat your constipation today: -Take 100 mg of docusate sodium (also known as Colace, however generic is fine!) by mouth twice daily to soften stools -Drink at least 64 oz of water daily -Add in 1 capful of polyethylene glycol (also known as Miralax, however generic is fine!) to beverage of choice daily  Goal is to have a formed, soft bowel movement regularly   -After a few days if no success, may increase to a total of 2 capfuls of polyethylene glycol/ Miralax daily  If still no results, please call the office

## 2024-03-26 LAB — URINE CULTURE
MICRO NUMBER:: 17121323
Result:: NO GROWTH
SPECIMEN QUALITY:: ADEQUATE

## 2024-06-03 LAB — OPHTHALMOLOGY REPORT-SCANNED

## 2024-06-17 ENCOUNTER — Ambulatory Visit: Admitting: Urology

## 2024-06-17 ENCOUNTER — Encounter: Payer: Self-pay | Admitting: Urology

## 2024-06-17 VITALS — BP 124/87 | HR 78 | Ht 71.0 in | Wt 186.1 lb

## 2024-06-17 DIAGNOSIS — M545 Low back pain, unspecified: Secondary | ICD-10-CM

## 2024-06-17 DIAGNOSIS — R82998 Other abnormal findings in urine: Secondary | ICD-10-CM | POA: Diagnosis not present

## 2024-06-17 LAB — URINALYSIS, ROUTINE W REFLEX MICROSCOPIC
Bilirubin, UA: NEGATIVE
Glucose, UA: NEGATIVE
Ketones, UA: NEGATIVE
Leukocytes,UA: NEGATIVE
Nitrite, UA: NEGATIVE
Protein,UA: NEGATIVE
RBC, UA: NEGATIVE
Specific Gravity, UA: 1.02 (ref 1.005–1.030)
Urobilinogen, Ur: 0.2 mg/dL (ref 0.2–1.0)
pH, UA: 6 (ref 5.0–7.5)

## 2024-06-17 NOTE — Progress Notes (Signed)
 "  Assessment: 1. Foamy urine   2. Right-sided low back pain without sciatica, unspecified chronicity     Plan: I personally reviewed the patient's chart including provider notes, lab and imaging results. I reassured the patient that I do not see any significant abnormalities on his urinalysis today.  I discussed possible causes of foamy urine including proteinuria.  No evidence of proteinuria today. I do not recommend any additional urologic evaluation at this time. Return to office prn  Chief Complaint:  Chief Complaint  Patient presents with   New Patient (Initial Visit)    History of Present Illness:  Daniel Davenport is a 39 y.o. male who is seen for evaluation of right sided low back pain with radiation to the right groin area and intermittent foamy urine.   He was previously seen by his PCP for evaluation of these symptoms. He had right sided low back pain with radiation to the right groin area.  This was more noticeable when he was lying down.  He has not had any pain for approximately 2 weeks.  No history of kidney stones or UTIs. He has also had intermittent foamy urine.  No dysuria or gross hematuria. He reports urinary frequency, nocturia x 1, and occasional weak stream. IPSS = 7/2.  Abdominal ultrasound from 6/25 showed no acute abnormalities. Abdominal films from 10/25 showed no obvious calcifications or other abnormalities. Urinalysis from 10/25 showed 0-2 WBCs, 0-2 RBCs, positive protein. Urine culture from 10/25 showed no growth.  Past Medical History:  Past Medical History:  Diagnosis Date   Abdominal pain    Chest pain    Diabetes mellitus without complication (HCC)    Elevated LFTs    Hyperlipidemia    Lightheadedness    Low back pain    Rib pain    Vitamin B 12 deficiency    Vitamin D  deficiency     Past Surgical History:  History reviewed. No pertinent surgical history.  Allergies:  Allergies[1]  Family History:  Family History  Problem  Relation Age of Onset   Diabetes Maternal Grandfather    Diabetes Paternal Grandfather    Heart disease Paternal Grandfather    Esophageal cancer Maternal Uncle    Prostate cancer Neg Hx    Colon cancer Neg Hx     Social History:  Social History[2]  Review of symptoms:  Constitutional:  Negative for unexplained weight loss, night sweats, fever, chills ENT:  Negative for nose bleeds, sinus pain, painful swallowing CV:  Negative for chest pain, shortness of breath, exercise intolerance, palpitations, loss of consciousness Resp:  Negative for cough, wheezing, shortness of breath GI:  Negative for nausea, vomiting, diarrhea, bloody stools GU:  Positives noted in HPI; otherwise negative for gross hematuria, dysuria, urinary incontinence Neuro:  Negative for seizures, poor balance, limb weakness, slurred speech Psych:  Negative for lack of energy, depression, anxiety Endocrine:  Negative for polydipsia, polyuria, symptoms of hypoglycemia (dizziness, hunger, sweating) Hematologic:  Negative for anemia, purpura, petechia, prolonged or excessive bleeding, use of anticoagulants  Allergic:  Negative for difficulty breathing or choking as a result of exposure to anything; no shellfish allergy; no allergic response (rash/itch) to materials, foods  Physical exam: BP 124/87   Pulse 78   Ht 5' 11 (1.803 m)   Wt 186 lb 1.3 oz (84.4 kg)   BMI 25.95 kg/m  GENERAL APPEARANCE:  Well appearing, well developed, well nourished, NAD HEENT: Atraumatic, Normocephalic, oropharynx clear. NECK: Supple without lymphadenopathy or thyromegaly. LUNGS: Clear to  auscultation bilaterally. HEART: Regular Rate and Rhythm without murmurs, gallops, or rubs. ABDOMEN: Soft, non-tender, No Masses. EXTREMITIES: Moves all extremities well.  Without clubbing, cyanosis, or edema. NEUROLOGIC:  Alert and oriented x 3, normal gait, CN II-XII grossly intact.  MENTAL STATUS:  Appropriate. BACK:  Non-tender to palpation.  No  CVAT SKIN:  Warm, dry and intact.    Results: U/A: Negative     [1] No Known Allergies [2]  Social History Tobacco Use   Smoking status: Former   Smokeless tobacco: Never  Vaping Use   Vaping status: Never Used  Substance Use Topics   Alcohol use: Yes    Comment: socially   Drug use: Never   "

## 2024-07-12 ENCOUNTER — Other Ambulatory Visit: Payer: Self-pay | Admitting: *Deleted

## 2024-07-12 MED ORDER — FREESTYLE LIBRE 3 PLUS SENSOR MISC: 2 refills | Status: AC

## 2024-07-12 MED ORDER — METFORMIN HCL 500 MG PO TABS: ORAL_TABLET | ORAL | 0 refills | Status: AC

## 2024-07-12 MED ORDER — ATORVASTATIN CALCIUM 10 MG PO TABS: 10.0000 mg | ORAL_TABLET | Freq: Every day | ORAL | 0 refills | Status: AC
